# Patient Record
Sex: Female | Born: 1945 | Race: White | Hispanic: No | Smoking: Never smoker
Health system: Southern US, Community
[De-identification: ages and names within clinical notes are randomized; demographics above are authoritative.]

## PROBLEM LIST (undated history)

## (undated) DIAGNOSIS — G5600 Carpal tunnel syndrome, unspecified upper limb: Secondary | ICD-10-CM

## (undated) DIAGNOSIS — M1990 Unspecified osteoarthritis, unspecified site: Secondary | ICD-10-CM

## (undated) DIAGNOSIS — Z9289 Personal history of other medical treatment: Secondary | ICD-10-CM

## (undated) DIAGNOSIS — G8929 Other chronic pain: Secondary | ICD-10-CM

## (undated) DIAGNOSIS — Z8719 Personal history of other diseases of the digestive system: Secondary | ICD-10-CM

## (undated) DIAGNOSIS — K219 Gastro-esophageal reflux disease without esophagitis: Secondary | ICD-10-CM

## (undated) DIAGNOSIS — M549 Dorsalgia, unspecified: Secondary | ICD-10-CM

## (undated) DIAGNOSIS — H9190 Unspecified hearing loss, unspecified ear: Secondary | ICD-10-CM

## (undated) HISTORY — PX: ABDOMINAL HYSTERECTOMY: SHX81

## (undated) HISTORY — PX: TONSILLECTOMY: SUR1361

## (undated) HISTORY — PX: CARPAL TUNNEL RELEASE: SHX101

## (undated) HISTORY — PX: TARSAL TUNNEL RELEASE: SUR1099

## (undated) HISTORY — PX: FRACTURE SURGERY: SHX138

## (undated) HISTORY — PX: APPENDECTOMY: SHX54

## (undated) HISTORY — PX: CATARACT EXTRACTION: SUR2

---

## 1970-06-04 DIAGNOSIS — Z9289 Personal history of other medical treatment: Secondary | ICD-10-CM

## 1970-06-04 HISTORY — DX: Personal history of other medical treatment: Z92.89

## 1997-12-07 ENCOUNTER — Ambulatory Visit (HOSPITAL_COMMUNITY): Admission: RE | Admit: 1997-12-07 | Discharge: 1997-12-07 | Payer: Self-pay | Admitting: Family Medicine

## 2000-02-14 ENCOUNTER — Ambulatory Visit (HOSPITAL_COMMUNITY): Admission: RE | Admit: 2000-02-14 | Discharge: 2000-02-14 | Payer: Self-pay | Admitting: Family Medicine

## 2000-02-14 ENCOUNTER — Encounter: Payer: Self-pay | Admitting: Family Medicine

## 2000-02-19 ENCOUNTER — Encounter: Payer: Self-pay | Admitting: Family Medicine

## 2000-02-19 ENCOUNTER — Encounter: Admission: RE | Admit: 2000-02-19 | Discharge: 2000-02-19 | Payer: Self-pay | Admitting: Family Medicine

## 2000-03-19 ENCOUNTER — Encounter: Admission: RE | Admit: 2000-03-19 | Discharge: 2000-03-19 | Payer: Self-pay | Admitting: Gastroenterology

## 2000-03-19 ENCOUNTER — Encounter: Payer: Self-pay | Admitting: Gastroenterology

## 2001-04-23 ENCOUNTER — Ambulatory Visit (HOSPITAL_COMMUNITY): Admission: RE | Admit: 2001-04-23 | Discharge: 2001-04-23 | Payer: Self-pay | Admitting: Family Medicine

## 2001-04-23 ENCOUNTER — Encounter: Payer: Self-pay | Admitting: Family Medicine

## 2004-11-07 ENCOUNTER — Ambulatory Visit (HOSPITAL_COMMUNITY): Admission: RE | Admit: 2004-11-07 | Discharge: 2004-11-07 | Payer: Self-pay | Admitting: Family Medicine

## 2006-06-04 HISTORY — PX: BACK SURGERY: SHX140

## 2006-09-19 ENCOUNTER — Ambulatory Visit: Admission: RE | Admit: 2006-09-19 | Discharge: 2006-09-19 | Payer: Self-pay | Admitting: Orthopedic Surgery

## 2006-10-02 ENCOUNTER — Ambulatory Visit: Payer: Self-pay | Admitting: Cardiovascular Disease

## 2006-10-14 ENCOUNTER — Encounter: Payer: Self-pay | Admitting: Cardiology

## 2006-10-14 ENCOUNTER — Ambulatory Visit: Payer: Self-pay

## 2006-11-26 ENCOUNTER — Inpatient Hospital Stay (HOSPITAL_COMMUNITY): Admission: RE | Admit: 2006-11-26 | Discharge: 2006-11-29 | Payer: Self-pay | Admitting: Orthopedic Surgery

## 2006-11-26 ENCOUNTER — Encounter (INDEPENDENT_AMBULATORY_CARE_PROVIDER_SITE_OTHER): Payer: Self-pay | Admitting: Orthopedic Surgery

## 2006-11-26 IMAGING — CR DG CHEST 1V PORT
1 series · 1 of 1 positions shown · non-contrast
Comparison: [DATE].

CLINICAL DATA: Central line placement.
 PORTABLE CHEST ([DATE] HOURS):

[view not recorded]
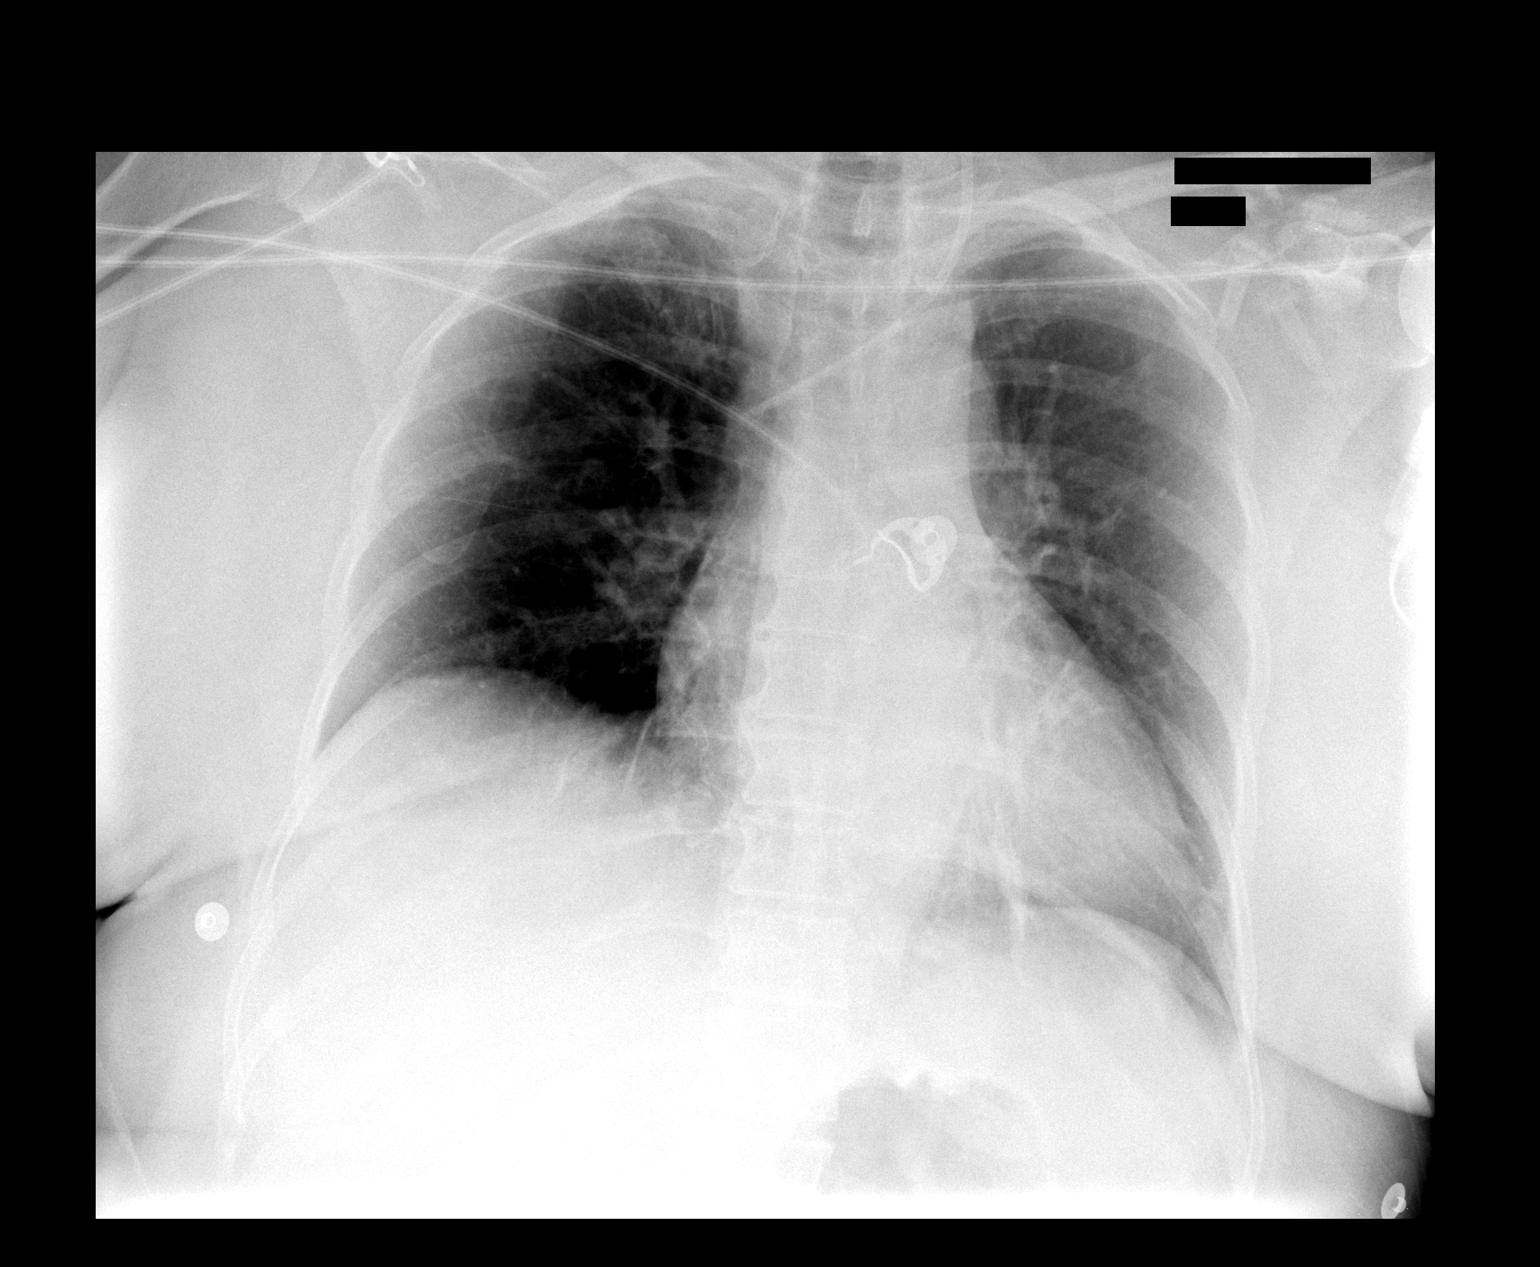

[1 of 1 positions shown; findings below may reference images not displayed]

FINDINGS: Left jugular central line tip is in the SVC.  No pneumothorax.  
 Decreased lung volume with mild atelectasis in the bases since the prior study.
IMPRESSION: 1.  Central line placement in the SVC.  No pneumothorax. 
 2.  Mild bibasilar atelectasis.

## 2007-06-23 ENCOUNTER — Ambulatory Visit (HOSPITAL_BASED_OUTPATIENT_CLINIC_OR_DEPARTMENT_OTHER): Admission: RE | Admit: 2007-06-23 | Discharge: 2007-06-24 | Payer: Self-pay | Admitting: *Deleted

## 2007-09-04 ENCOUNTER — Ambulatory Visit (HOSPITAL_BASED_OUTPATIENT_CLINIC_OR_DEPARTMENT_OTHER): Admission: RE | Admit: 2007-09-04 | Discharge: 2007-09-04 | Payer: Self-pay | Admitting: Orthopedic Surgery

## 2008-05-05 ENCOUNTER — Ambulatory Visit (HOSPITAL_COMMUNITY): Admission: RE | Admit: 2008-05-05 | Discharge: 2008-05-05 | Payer: Self-pay | Admitting: Family Medicine

## 2010-10-17 NOTE — Op Note (Signed)
NAMEJANYAH, [name] NO.:  [id number]   MEDICAL RECORD NO.:  [id number]          PATIENT TYPE:  INP   LOCATION:  5014                         FACILITY:  [facility]   PHYSICIAN:  Nelda Severe, MD      DATE OF BIRTH:  [birth year]   DATE OF PROCEDURE:  11/26/2006  DATE OF DISCHARGE:                               OPERATIVE REPORT   SURGEON:  Nelda Severe, M.D.   ASSISTANT:  [name], P.A.-C.   PREOPERATIVE DIAGNOSIS:  Central disc herniation L5-S1.   POSTOPERATIVE DIAGNOSIS:  Central disc herniation L5-S1.   OPERATIVE PROCEDURE:  Attempted L5-S1 disc arthroplasty, anterior  interbody fusion L5-S1 with Synthes Synfix device and Infuse.   PROCEDURE NOTE:  The patient was placed under general endotracheal  anesthesia.  A Foley catheter was placed in the bladder.  She was  positioned supine on the [place] table.  The arms were abducted on arm  boards to the side.  The hips and knees were gently flexed and supported  on pillows.  Sequential compression devices were in place on both lower  extremities.  A pulse oximeter was placed on the left great toe.  Prior  to induction of anesthesia, a central line had been placed by the  anesthesiologist.  The suprapubic area was clipped of hair.  The abdomen  was prepped with DuraPrep and draped in square fashion.  The drapes were  secured with Ioban.   An oblique left lower quadrant incision was made from the midline  medially to a point between the umbilicus and the iliac crest.  Dissection was carried down through a very thick adipose layer, at least  4 cm, to the anterior rectus sheath which was cut in line with the  incision obliquely.  The rectus sheath was separated from the muscle  anteriorly.  The medial edge of the rectus was identified and retracted  laterally.  The arcuate line was identified.  Blunt dissection was  carried out into the preperitoneal fat and then around laterally into  the retroperitoneum.   The peritoneal sac was separated off the distal  portion of the posterior rectus sheath and posterior rectus sheath  divided longitudinally for about 3-4 cm.   The wound was exceedingly deep.  Self-retaining Thompson retractors were  placed to initially hold the peritoneal sac towards the right side.  The  common iliac vessels on the left side were identified.  The ureter was  on the back of the peritoneum.  The disc space at L5-S1 was identified.  Bipolar coagulation was used to coagulate the median sacral vessels  which were then divided.  The common iliac vessels on the left side were  then mobilized off the disc and it Braugh retractor blade placed and  attached to the [place] frame.  We then used a Braugh blade to dissect  laterally and retract the right common iliac vessels.  Two other Braugh  blades were used to retract.  One was used to reach retract the more  proximal portion of the left common iliac vein proximally and the other  to retract the  pelvic contents distally.  We placed a screw in what I  perceived to be the center of the disc at L5-S1.  A cross table lateral  radiograph was taken which showed the correct level and an AP radiograph  showing that the screw was to the right of midline.   We then marked the inferior edge of the L5 vertebral body a distance of  about 5 mm left of the center of the screw head.  The screw was removed.  We then incised the disc in a rectangular fashion.  Special elevators  were then used to separate the disc from the endplate above and below.  Curets were used to separate the annular fibers laterally.  About 85% of  the disc was then delivered in one piece.   I then used curets judiciously to remove the annulus posteriorly back to  the spinal canal.  We did not actually exposed dura, but an angled curet  was admitted over the top of the sacrum posteriorly and behind the  posterior edge of L5 proximally.  Once I had cleared the disc space  out  as well as possible and released the fibers, a small block Prodisc trial  spacer was inserted.  The radiograph showed that it was off center, to  the right.  I then proceeded to do more releasing on the left side.  Approximately 45 to 60 minutes was spent trying to get the spacer to  seat stable in the central location as judged by radiographs.  This  ultimately appeared impossible.  I felt that in order to do this, I  would have to remove some endplate from S1 and L5 on the left side and  that this risked weakening the bone and the resulting in a fracture.  Therefore, I decided to perform an anterior interbody fusion using the  Synfix device and BMP.   The small footprint block, minimum thickness, was inserted and fit  nicely, although somewhat to the right side, again.  The appropriate  implant was chosen.  A small pack of BMP had been opened and the  collagen fiber sponge soaked with Infuse.  The slots in the spacer were  then filled with collagen fiber soaked with BMP.  The implant was  inserted with the Squid inserter.  It was countersunk slightly with a  mallet and impactor.  We then mounted the jig for creating holes in the  L5-S1 vertebra.  These holes were made and 2 cm screws inserted and the  implants fixed in place.   A very small amount of bleeding distal to the L5-S1 space was  controlled, after removing the distally placed retractor with bipolar  coagulation and a piece of Gelfoam placed over an area that was oozing.  The other retractors were all removed sequentially.  There was no more  bleeding.  The rectus sheath was then closed in continuous fashion using  0 Vicryl suture.  The subcutaneous layer was closed using interrupted 2-  0 Vicryl suture and the skin closed using subcuticular 3-0 undyed Vicryl  continuously.  The skin edges were reinforced Steri-Strips.  A  nonadherent antibiotic dressing was applied and secured with OpSite.   It should be noted that  about 45 minutes prior to the termination of  procedure, the pulse oximeter on the left great toe suddenly began to  read 0.  This was not at a point when we had changed retractors or done  any physical maneuver in the wound.  This was of some concern, however,  about 30 minutes later it suddenly began to read 98-100% oxygen  saturation in the great toe and continued to read that level of  saturation as well as sensing the pulse.   While [employer], P.A.-C., was closing the skin, I scrubbed out and  could palpate the dorsalis pedis pulse of the left foot very well.  I  could not palpate the pedal pulses on the right side, but the capillary  filling was extremely brisk and the foot was warm.  We will try again in  the recovery room to palpate the pulse.   There were no intraoperative complications, other than our inability to  utilize the Prodisc implant.  I spoke with the patient's family about  the fact that we performed anterior interbody fusion because of the fact  that I felt that I could not achieve technical adequacy with the disc  replacement.  Blood loss estimated at 600 mL.  Sponge and needle counts  were correct.      Nelda Severe, MD  Electronically Signed    MT/MEDQ  D:  11/26/2006  T:  11/26/2006  Job:  [id number]

## 2010-10-17 NOTE — Discharge Summary (Signed)
NAMEJAHNAY, LANTIER NO.:  [id number]   MEDICAL RECORD NO.:  [id number]          PATIENT TYPE:  INP   LOCATION:  5014                         FACILITY:  [facility]   PHYSICIAN:  Nelda Severe, MD      DATE OF BIRTH:  01-31-1946   DATE OF ADMISSION:  11/26/2006  DATE OF DISCHARGE:  11/29/2006                               DISCHARGE SUMMARY   [name] was brought into [facility] on November 26, 2006 by Dr.  Nelda Severe for central disc herniation at L5-S1.   PREOPERATIVE DIAGNOSIS:  Central disc herniation L5-S1.   HISTORY OF BRIEF STAY:  Patient was taken to the OR November 26, 2006.  Dr.  Nelda Severe actually performed a fusion at anterior L5-S1 lumbar  spine.  Patient tolerated this well in the postoperative care unit.  Active range of motion was intact to bilateral lower extremities.  Sensation was grossly intact and palpable DP pulses.  Estimated blood  loss was 800 mL.  She was then admitted to [facility], Room [id number].  Postoperative day one, patient was stable.  Back feels much better per  patient.  She was afebrile.  Vital signs were stable.  Her hemoglobin  was 8.5, white blood cell count 15.2, PT was 16.4 and INR 1.3.  Electrolytes were within normal limits.  Palpable pulses, active range  of motion and sensation.  Bilateral lower extremities were intact and  equal.  Calves were soft and nontender to palpation; TED hose were on  and SCDs were in place as well.  Physical therapy was ordered for  ambulation.  PT worked with the patient on November 27, 2006, stated the  patient was independent with mobility and walking with a rolling walker  and discharged from physical therapy; goals were met.  Postoperative day  two, patient was on bedside toilet, passing gas, eating a regular diet  at this point.  She had a temperature maximum of 99.3.  Vital signs were  stable.  PT was 24.1, INR 2.1.  Ambulating with standby assist.  Calves  were soft, nontender.  Active  range of motion.  Bilateral lower  extremities intact.  Dressing was clean and dry on the abdomen.  She has  a small area on the anterolateral left thigh that has decreased  sensation compared to the rest of the lower extremity, otherwise no  musculoskeletal changes.  Discontinued her PCA her and Foley and gave  her 3 mg of Coumadin.  Postoperative day three, patient states she is  ready to go home.  She is doing well.  She still have the numbness on  the anterolateral thigh.  She has no temperature.  She is afebrile.  Vital signs are stable.  Her PT/INR is at 1.9.  Hemoglobin stable at  8.8, white count normal.  Incision site clean, no active drainage.  She  does have a small pinpoint of blood on the very proximal portion of the  incision.  There is no abnormal erythema.  Belly is soft.  Positive  bowel sounds.  Distally neurovascular motor intact.  Calves soft  nontender.   DISCHARGE DIAGNOSES:  Status post anterior lumbar fusion L5-S1.   PLAN:  We are going to get home health for PT/INR for six weeks total  from day of surgery.  I am going to order her a rolling walker, a 3-in-1  toilet seat/shower chair and sending her home with Norco 10/325 one to  two every four hours p.r.n. for pain control and Coumadin, we are going  to give her 4 mg to take tonight at 6:00 p.m. and that will be dosed  according to her PT/INR blood draws.  Want her to follow up in the  office late July or early August.  If she has any troubles or concerns,  she is welcome to call us at any time, this was discussed with her and  her husband at bedside and this was all agreed upon.   DISPOSITION:  Stable.   DIET:  Regular.      Courtney Hill, P.A.      Nelda Severe, MD  Electronically Signed    MC/MEDQ  D:  11/29/2006  T:  11/30/2006  Job:  [id number]

## 2010-10-17 NOTE — H&P (Signed)
NAMELEXANY, BELKNAP               ACCOUNT NO.:  [id number]   MEDICAL RECORD NO.:  [id number]          PATIENT TYPE:  INP   LOCATION:  NA                           FACILITY:  [facility]   PHYSICIAN:  [name], P.A.  DATE OF BIRTH:  12/08/1945   DATE OF ADMISSION:  DATE OF DISCHARGE:                              HISTORY & PHYSICAL   CHIEF COMPLAINT:  Lumbar pain status post fusion L5-S1 area.   ALLERGIES TO MEDICATIONS:  No known drug allergies.   CURRENT MEDICATIONS:  Include Vicodin 5/500 and Prilosec OTC.   PAST SURGICAL HISTORY:  Includes:  1. Bilateral carpal tunnel.  2. Hysterectomy.  3. Appendectomy.  4. Tonsil tunnel on the left.  5. Plantar fascial release on the left.  6. Childbirth x6.   PAST HISTORY OF HER FAMILY:  Includes hypertension, cancer, melanoma,  leukemia, seizures.   REVIEW OF SYSTEMS:  She reports no recent weight loss, weight gain.  No  night sweats.  No fevers.  No chills.  No chronic cough.  No changes in  vision or hearing.  No seizures or blackouts.  No bowel or bladder  incontinence.   PHYSICAL EXAMINATION:  VITAL SIGNS:  Her temperature was 97.7, pulse was  73, respirations 12, blood pressure 126/80.  GENERAL:  She is well-developed, well-nourished.  HEENT:  Normocephalic in appearance.  Pupils are equal and round,  reactive to light.  NECK:  Active range of motion, full.  No adenopathy.  CHEST:  Clear to auscultation.  No wheezes.  HEART:  Regular rate and rhythm.  No murmur.  ABDOMEN:  Soft, nontender to palpation.  Positive bowel sounds on  auscultation.  EXTREMITIES:  Grossly neurovascularly and motor intact.  SKIN:  Clean, dry and intact.   MRI shows L5-S1 disc herniation, central.   PLAN:  Anterior disc replacement with ProDisc by Dr. Nelda Severe.     [name], P.A.    MC/MEDQ  D:  11/25/2006  T:  11/25/2006  Job:  [id number]

## 2010-10-17 NOTE — Assessment & Plan Note (Signed)
[facility]                            CARDIOLOGY OFFICE NOTE   NAME:FIELDSMai, Longnecker                      MRN:          [id number]  DATE:10/02/2006                            DOB:          1946/05/13    HISTORY OF PRESENT ILLNESS:  Mrs. Rattan was seen today for preop  clearance and an abnormal EKG.  Dr. Alveda Reasons is to do a disk replacement on  her.  The surgery was apparently scheduled for April 22, but was  cancelled due to an abnormal EKG.   I reviewed he EKG's from Dr. Burr Medico office.  She has an incomplete right  bundle branch block and Q waves in leads 3 and F.  In talking to the  patient, she has never had a heart problem.  She really does not have  significant risk factors.  She is not hypertensive, nondiabetic.  She is  not a smoker.  Family history is unremarkable.   The patient's activity is primarily limited by her back pain.  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  There is some exertional Nelda Severe, MD,  but this sounds more like deconditioning.   In particular, there has been no emphysema, no COPD, no chronic lung  problems.  She has never had palpitations, arrhythmias, syncope or chest  pain.   She has not had any major surgery since 2006 when she had carpal tunnel,  but she has never had an aesthetic problem.   There is also no history of bleeding diaphysis.   The patient has not had a stress test or echo in the past.   She denies any history of myocardial infarction.   REVIEW OF SYSTEMS:  Otherwise, negative.   Family history non-contributory   The patient hurt her back many years ago, and it was from her mail  carrying route.  Apparently, she was prescribed some physical therapy  after her carpal tunnel surgery, and feels that it was too aggressive,  and then she hurt her disk.   She is married.  She has 6 children and 10 grandchildren.  Unfortunately, her mother and father, as well as a brother, all died in  a car  accident.  He has limited hobbies and does not drink or smoke.   The patient is currently on disability as she is unable to carry the  mail.   PAST SURGICAL HISTORY:  1. Bilateral carpal tunnel surgery in 2006.  2. Tarsal tunnel release and plantar fasciitis surgery in 2004 and      2003.  3. Hysterectomy in 1984.  4. Appendectomy in 1968.   MEDICATIONS:  1. Pain medications.  2. Hydrocodone  3. Prilosec 20 mg daily.   ALLERGIES:  No known drug allergies.   PHYSICAL EXAMINATION:  Healthy appearing white female in no distress  VITAL SIGNS:  Blood pressure 130/70, pulse 70 and regular. RR 12  afebrile  HEENT:  Normal.  LUNGS:  Clear.  No carotid bruits.  No thyromegaly.  HEART:  Normal S1, S2.  Normal heart sounds. PMI normal  ABDOMEN:  Benign.  NO masses  AAA, HSM or HJr  LOWER EXTREMITIES:  Intact pulses, no edema.  NEUROLOGICAL:  Nonfocal.  SKIN:  Warm and dry.  No lymadenopathy  No muscular weakness   IMPRESSION:  Preop clearance in an overweight 65 year old with limited  risk factors.  The patient does have Q waves in 3 and F with an  incomplete right bundle branch block.  I think it is reasonable to  proceed with an Adenosine Myoview.  Will also do a 2D echocardiogram to  rule out regional wall motion abnormalities.  The patient does get some  exertional dyspnea.   However, I suspect that both these tests will be normal, and she will be  able to schedule her surgery after next week with Dr. Alveda Reasons.   The patient will continue her pain medication for her disk problems.  She will continue her Prilosec for some reflex that she gets after  taking her pain medication.  Otherwise, her physical examination is  normal, and I think that she should do well with these tests and with  her back surgery in the next few weeks.     Noralyn Pick. Eden Emms, MD, [facility]  Electronically Signed    PCN/MedQ  DD: 10/02/2006  DT: 10/02/2006  Job #: [id number]   cc:   Nelda Severe, MD

## 2010-10-17 NOTE — Op Note (Signed)
Courtney Hill, Courtney Hill               ACCOUNT NO.:  [id number]   MEDICAL RECORD NO.:  [id number]          PATIENT TYPE:  AMB   LOCATION:  DSC                          FACILITY:  [facility]   PHYSICIAN:  Lowell Bouton, M.D.DATE OF BIRTH:  December 25, 1945   DATE OF PROCEDURE:  06/23/2007  DATE OF DISCHARGE:                               OPERATIVE REPORT   PREOPERATIVE DIAGNOSIS:  Comminuted intra-articular fracture, left  distal radius.   POSTOPERATIVE DIAGNOSIS:  Comminuted intra-articular fracture, left  distal radius.   PROCEDURE:  Open reduction internal fixation, left distal radius  fracture.   SURGEON:  Dr. Metro Kung.   ANESTHESIA:  General.   OPERATIVE FINDINGS:  The patient had a comminuted fracture of the distal  radius with volar angulation and intra-articular component.   PROCEDURE:  Under general anesthesia with a tourniquet on the left arm,  the left hand was prepped and draped in usual fashion.  After  exsanguinating the limb, the tourniquet was inflated to 250 mmHg.  A  longitudinal incision was made overlying the FCR tendon in the volar  forearm.  It was carried down through the subcutaneous tissues, and  bleeding points were coagulated.  Blunt dissection was carried down to  the FCR tendon sheath which was incised longitudinally.  The tendon was  then retracted ulnarly, and the floor of the sheath was incised with the  scissors.  Blunt dissection was carried down radial to the FPL muscle  belly, and Weitlaner's retractors were inserted.  The pronator quadratus  was identified and was divided longitudinally from its insertion on the  radius.  It was then elevated with a Therapist, nutritional.  The fracture site  was identified and was cleansed.  Prior to the incision, the index, long  and ring fingers had been placed in finger traps with 10 pounds of  traction suspended over the end of the arm board.  The fracture was then  reduced, and the volar tilt was corrected.  A  Freer elevator was used to  reduce the volar fragments.  DVR plate was then inserted, and x-rays  showed good alignment of the fracture and good position of the plate.  The plate was then applied using a 3.5-mm screw in the slotted hole  proximally.  Five pegs were then inserted distally.  X-rays showed good  position with no evidence of penetration of the joint by the pegs.  The  remaining two screws were placed proximally.  A sixth peg was not  inserted.  The final x-rays including fluoroscopic visualization of the  fracture site looked near anatomic.  The wound was then irrigated  copiously with saline.  A TLSO drain was inserted.  The pronator  quadratus was repaired with 4-0 Vicryl.  Half percent Marcaine was  placed in the skin edges for pain control.  Subcutaneous tissue was  closed with Vicryl.  The skin was closed  with a 3-0 subcuticular Prolene.  Steri-Strips were applied followed by  sterile dressings and a volar wrist splint.  The patient tolerated the  procedure well.  Tourniquet was released with good circulation of  the  hand.  She went to the recovery room awake and stable and good  condition.      Lowell Bouton, M.D.  Electronically Signed     EMM/MEDQ  D:  06/23/2007  T:  06/23/2007  Job:  [id number]

## 2010-10-17 NOTE — Op Note (Signed)
NAMEMYLEY, BAHNER               ACCOUNT NO.:  [id number]   MEDICAL RECORD NO.:  [id number]          PATIENT TYPE:  AMB   LOCATION:  NESC                         [facility]AC[facility]   PHYSICIAN:  Deidre Ala, M.D.    DATE OF BIRTH:  [birth year]   DATE OF PROCEDURE:  09/04/2007  DATE OF DISCHARGE:                               OPERATIVE REPORT   PREOPERATIVE DIAGNOSES:  1. Left fourth trigger finger stenosing tenosynovitis, A1 pulley.  2. Trigger finger A1 pulley, left second; and trigger finger, left A1,      left third finger.   POSTOPERATIVE DIAGNOSES:  1. Left fourth trigger finger stenosing tenosynovitis, A1 pulley.  2. Trigger finger A1 pulley, left second; and trigger finger, left A1,      left third finger.   PROCEDURE:  1. Release A1 pulley, left fourth trigger finger, STS.  2. Inject cortisone, Marcaine, left second finger A1 pulley trigger      finger.  3. Inject cortisone, Marcaine, left third A1 pulley trigger finger.   SURGEON:  Doristine Section, MD.   ASSISTANT:  Phineas Semen, PAC.   ANESTHESIA:  General with LMA.   CULTURES:  None.   DRAINS:  None.   ESTIMATED BLOOD LOSS:  Minimal.   TOURNIQUET TIME:  13 minutes.   PATHOLOGIC FINDINGS AND HISTORY:  Courtney Hill is a 65 year old female who has  had longstanding repetitive inflammation of her left arm and hand with  carpal tunnel syndrome in the past.  We diagnosed and have treated  conservatively trigger fingers, left fourth, second and third.  She has  had several injections but ultimately the fourth finger was the most  problematic with triggering and we sought approval from workers' comp  from the [employer] and finally it came through.  She is still  having active triggering, left fourth, and the second and third were  milder, so we elected only to inject those.  We used 1 mL of 80 mg/mL  Depo-Medrol, Marcaine and 1 mL 80 mg Depo-Medrol and 1 mL 0.5% Marcaine  plain into both fingers at the A1  pulley.   PROCEDURE:  With adequate anesthesia obtained using LMA technique, 1  gram Ancef given IV prophylaxis, the patient was placed in the supine  position.  The left upper extremity was prepped from the fingertips to  the upper forearm in a standard fashion.  After standard prepping and  draping, Esmarch exsanguination was used.  The tourniquet was let up to  250 mmHg.  We then made an incision along the palmar flexion crease of  the MP joint in the palm distally at the A1 pulley of the left fourth  finger.  The incision was deepened sharply with a knife and hemostasis  obtained using the Bovie electrocoagulator.  Under loupe magnification,  dissection was carried down to the subcutaneum and then down to the  tendon sheath.  Retractors were placed and the tendon sheath A1 pulley  was split longitudinally from proximal to distal, releasing the flexor  tendon and allowing it to glide.  Care was taken to protect the  digital  bundles on both sides and the neurovascular bundles.  Irrigation was  carried out.  That wound was closed with a running 4-0 nylon.  Both the  second and third finger were then injected subcutaneously along the A1  pulley with the above-listed mixture.  A bulky sterile compressive  dressing was applied and the patient, having tolerated procedure well,  was awakened, taken to the recovery room in satisfactory condition to be  discharged per outpatient routine.  Given Vicodin for pain and told call  the office for recheck on Saturday.           ______________________________  V. Charlesetta Shanks, M.D.     VEP/MEDQ  D:  09/04/2007  T:  09/04/2007  Job:  [id number]   cc:   Deidre Ala, M.D.  Fax: [phone]

## 2010-10-20 NOTE — H&P (Signed)
Courtney Hill, Courtney Hill               ACCOUNT NO.:  [id number]   MEDICAL RECORD NO.:  [id number]          PATIENT TYPE:  INP   LOCATION:  NA                           FACILITY:  [facility]   PHYSICIAN:  Nelda Severe, MD      DATE OF BIRTH:  Mar 16, 1946   DATE OF ADMISSION:  DATE OF DISCHARGE:                              HISTORY & PHYSICAL   INDICATIONS:  This is a 65 year old female who comes in with central low  back pain at the area of L5-S1.   DRUG ALLERGIES:  NO KNOWN DRUG ALLERGIES.   CURRENT MEDICATIONS:  Include:  1. Vicodin 5/500 one q.6.  2. Prilosec over-the-counter 20 mg once daily for acid reflux.   PAST SURGICAL HISTORY:  Includes:  1. Carpal tunnel.  2. Hysterectomy.  3. Appendectomy.  4. Tarsal tunnel.  5. Plantar facial release.  6. Childbirth x6.   FAMILY HISTORY:  Includes coronary artery disease, cancer, seizures and  lung disease.   REVIEW OF SYSTEMS:  This patient reports no fever.  No chills.  No night  sweats.  No chronic cough.  No nausea, vomiting or diarrhea.  No  hemoptysis.  No change in vision or hearing.  She does report frequent  loose stools, nervous tension and sleep disturbance.   PHYSICAL EXAMINATION:  VITAL SIGNS:  Today, her temperature is 97.2.  Pulse is 55.  Respirations 20.  Blood pressure is 153/76.  GENERAL:  She is well-developed, well-nourished.  HEENT:  Her pupils are equal, round and reactive to light.  NECK:  Supple.  Nontender to palpation.  Full range of motion.  CHEST:  Clear to auscultation.  No wheezes noted.  HEART:  Regular rate and rhythm.  No murmur noted.  ABDOMEN:  Soft, nontender to palpation.  She has positive bowel sounds  on auscultation.  Sensation to light touch.  EXTREMITIES:  Bilateral lower extremities intact and equal.  Active  range of motion and strength are 5/5 bilateral lower extremities.  SKIN:  Clean, dry and intact.   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   PLAN:  Prodisc replacement, anterior lumbar L5-S1, by Dr. Nelda Severe.      [name], P.A.      Nelda Severe, MD  Electronically Signed    MC/MEDQ  D:  09/20/2006  T:  09/21/2006  Job:  [phone]

## 2011-02-22 LAB — POCT HEMOGLOBIN-HEMACUE: Hemoglobin: 12.4

## 2011-02-27 LAB — POCT HEMOGLOBIN-HEMACUE
Hemoglobin: 16.3 — ABNORMAL HIGH
Operator id: 114531

## 2011-03-21 LAB — COMPREHENSIVE METABOLIC PANEL
ALT: 42 — ABNORMAL HIGH
Alkaline Phosphatase: 56
BUN: 12
CO2: 23
Chloride: 106
Glucose, Bld: 93
Potassium: 4.5
Sodium: 137
Total Bilirubin: 0.5
Total Protein: 7.2

## 2011-03-21 LAB — CBC
HCT: 25.5 — ABNORMAL LOW
HCT: 25.7 — ABNORMAL LOW
HCT: 39.2
Hemoglobin: 13.1
MCHC: 33.5
MCHC: 33.7
MCHC: 34.1
MCV: 85.4
MCV: 86.5
MCV: 86.8
Platelets: 184
Platelets: 189
Platelets: 204
RBC: 4.55
RDW: 13.4
RDW: 13.5
WBC: 10
WBC: 10.4
WBC: 11.5 — ABNORMAL HIGH

## 2011-03-21 LAB — URINALYSIS, ROUTINE W REFLEX MICROSCOPIC
Bilirubin Urine: NEGATIVE
Hgb urine dipstick: NEGATIVE
Ketones, ur: NEGATIVE
Specific Gravity, Urine: 1.003 — ABNORMAL LOW (ref 1.005–1.035)
Urobilinogen, UA: 0.2
pH: 6.5

## 2011-03-21 LAB — BASIC METABOLIC PANEL
BUN: 11
BUN: 7
BUN: 8
CO2: 29
Calcium: 7.9 — ABNORMAL LOW
Chloride: 103
Chloride: 108
Creatinine, Ser: 0.64
Creatinine, Ser: 0.75
Glucose, Bld: 122 — ABNORMAL HIGH
Glucose, Bld: 153 — ABNORMAL HIGH
Potassium: 4.3

## 2011-03-21 LAB — DIFFERENTIAL
Basophils Absolute: 0
Basophils Relative: 0
Eosinophils Absolute: 0.2
Monocytes Relative: 9
Neutro Abs: 6.9
Neutrophils Relative %: 69

## 2011-03-21 LAB — URINE CULTURE: Colony Count: NO GROWTH

## 2011-03-21 LAB — TYPE AND SCREEN
ABO/RH(D): AB POS
Antibody Screen: NEGATIVE

## 2011-03-21 LAB — PROTIME-INR
INR: 1
INR: 1.9 — ABNORMAL HIGH
Prothrombin Time: 22.7 — ABNORMAL HIGH

## 2011-06-18 DIAGNOSIS — R51 Headache: Secondary | ICD-10-CM | POA: Diagnosis not present

## 2011-06-18 DIAGNOSIS — Z23 Encounter for immunization: Secondary | ICD-10-CM | POA: Diagnosis not present

## 2011-06-18 DIAGNOSIS — Z1212 Encounter for screening for malignant neoplasm of rectum: Secondary | ICD-10-CM | POA: Diagnosis not present

## 2011-07-18 DIAGNOSIS — M47817 Spondylosis without myelopathy or radiculopathy, lumbosacral region: Secondary | ICD-10-CM | POA: Diagnosis not present

## 2012-03-27 DIAGNOSIS — M47817 Spondylosis without myelopathy or radiculopathy, lumbosacral region: Secondary | ICD-10-CM | POA: Diagnosis not present

## 2012-03-29 ENCOUNTER — Emergency Department (HOSPITAL_COMMUNITY): Payer: Medicare Other

## 2012-03-29 ENCOUNTER — Encounter (HOSPITAL_COMMUNITY): Payer: Self-pay | Admitting: *Deleted

## 2012-03-29 ENCOUNTER — Emergency Department (HOSPITAL_COMMUNITY)
Admission: EM | Admit: 2012-03-29 | Discharge: 2012-03-30 | Disposition: A | Discharge status: Home / Self Care | Payer: Medicare Other | Attending: Emergency Medicine | Admitting: Emergency Medicine

## 2012-03-29 DIAGNOSIS — X500XXA Overexertion from strenuous movement or load, initial encounter: Secondary | ICD-10-CM | POA: Insufficient documentation

## 2012-03-29 DIAGNOSIS — S42209A Unspecified fracture of upper end of unspecified humerus, initial encounter for closed fracture: Secondary | ICD-10-CM | POA: Diagnosis not present

## 2012-03-29 DIAGNOSIS — M549 Dorsalgia, unspecified: Secondary | ICD-10-CM | POA: Diagnosis not present

## 2012-03-29 DIAGNOSIS — Z79899 Other long term (current) drug therapy: Secondary | ICD-10-CM | POA: Diagnosis not present

## 2012-03-29 DIAGNOSIS — S42309A Unspecified fracture of shaft of humerus, unspecified arm, initial encounter for closed fracture: Secondary | ICD-10-CM

## 2012-03-29 DIAGNOSIS — R040 Epistaxis: Secondary | ICD-10-CM | POA: Insufficient documentation

## 2012-03-29 DIAGNOSIS — Y939 Activity, unspecified: Secondary | ICD-10-CM | POA: Insufficient documentation

## 2012-03-29 DIAGNOSIS — Z8739 Personal history of other diseases of the musculoskeletal system and connective tissue: Secondary | ICD-10-CM | POA: Insufficient documentation

## 2012-03-29 DIAGNOSIS — Y929 Unspecified place or not applicable: Secondary | ICD-10-CM | POA: Insufficient documentation

## 2012-03-29 DIAGNOSIS — M79609 Pain in unspecified limb: Secondary | ICD-10-CM | POA: Diagnosis not present

## 2012-03-29 HISTORY — DX: Dorsalgia, unspecified: M54.9

## 2012-03-29 HISTORY — DX: Carpal tunnel syndrome, unspecified upper limb: G56.00

## 2012-03-29 HISTORY — DX: Other chronic pain: G89.29

## 2012-03-29 MED ORDER — ONDANSETRON HCL 4 MG/2ML IJ SOLN
4.0000 mg | Freq: Once | INTRAMUSCULAR | Status: AC
  Administered 2012-03-29: 4 mg via INTRAVENOUS
  Filled 2012-03-29: qty 2

## 2012-03-29 MED ORDER — ONDANSETRON 8 MG PO TBDP: 8.0000 mg | ORAL_TABLET | Freq: Three times a day (TID) | ORAL | Status: DC | PRN

## 2012-03-29 MED ORDER — HYDROMORPHONE HCL PF 1 MG/ML IJ SOLN
1.0000 mg | Freq: Once | INTRAMUSCULAR | Status: AC
  Administered 2012-03-29: 1 mg via INTRAVENOUS
  Filled 2012-03-29: qty 1

## 2012-03-29 MED ORDER — SODIUM CHLORIDE 0.9 % IV SOLN
Freq: Once | INTRAVENOUS | Status: AC
  Administered 2012-03-29: 50 mL via INTRAVENOUS

## 2012-03-29 MED ORDER — ONDANSETRON 4 MG PO TBDP
8.0000 mg | ORAL_TABLET | Freq: Once | ORAL | Status: AC
  Administered 2012-03-29: 8 mg via ORAL
  Filled 2012-03-29: qty 2

## 2012-03-29 MED ORDER — OXYCODONE-ACETAMINOPHEN 5-325 MG PO TABS: 1.0000 | ORAL_TABLET | Freq: Four times a day (QID) | ORAL | Status: DC | PRN

## 2012-03-29 NOTE — ED Notes (Signed)
PT turned her ankle on uneven pavement and fell, catching her fall with her L arm.  Pt will not let go of her L arm to allow assessment.

## 2012-03-29 NOTE — ED Provider Notes (Signed)
History     CSN: [id number]  Arrival date & time 03/29/12  1950   First MD Initiated Contact with Patient 03/29/12 2129      Chief Complaint  Patient presents with  . Fall    (Consider location/radiation/quality/duration/timing/severity/associated sxs/prior treatment) HPI Comments: Patient presents with complaint of left upper arm pain that began acutely just prior to arrival. Patient states she twisted her ankle and fell to the ground onto an outstretched left arm. Patient lightly struck the front of her head. She denies loss of consciousness. Patient denies blurry vision or vomiting. She denies any use of blood thinning medications. She denies confusion. Patient's denies pain in other areas including her neck and legs. Onset acute. Course is constant. Movement makes the pain worse. Nothing makes it better.  Patient is a 66 y.o. female presenting with fall. The history is provided by the patient.  Fall Pertinent negatives include no fever, no numbness, no abdominal pain, no nausea, no vomiting and no headaches.    Past Medical History  Diagnosis Date  . Chronic back pain   . Carpal tunnel syndrome     Past Surgical History  Procedure Date  . Back surgery   . Carpal tunnel release     No family history on file.  History  Substance Use Topics  . Smoking status: Never Smoker   . Smokeless tobacco: Not on file  . Alcohol Use: No    OB History    Grav Para Term Preterm Abortions TAB SAB Ect Mult Living                  Review of Systems  Constitutional: Negative for fever and fatigue.  HENT: Positive for nosebleeds. Negative for sore throat, rhinorrhea, neck pain and tinnitus.   Eyes: Negative for photophobia, pain, redness and visual disturbance.  Respiratory: Negative for cough and shortness of breath.   Cardiovascular: Negative for chest pain.  Gastrointestinal: Negative for nausea, vomiting, abdominal pain and diarrhea.  Genitourinary: Negative for dysuria.    Musculoskeletal: Positive for myalgias and arthralgias. Negative for back pain, joint swelling and gait problem.  Skin: Positive for wound. Negative for color change and rash.  Neurological: Negative for dizziness, weakness, light-headedness, numbness and headaches.  Psychiatric/Behavioral: Negative for confusion and decreased concentration.    Allergies  Review of patient's allergies indicates no known allergies.  Home Medications   Current Outpatient Rx  Name Route Sig Dispense Refill  . HYDROCODONE-ACETAMINOPHEN 5-325 MG PO TABS Oral Take 1 tablet by mouth every 6 (six) hours as needed. For pain    . OMEPRAZOLE MAGNESIUM 20 MG PO TBEC Oral Take 20 mg by mouth daily.    Marland Kitchen ONDANSETRON 8 MG PO TBDP Oral Take 1 tablet (8 mg total) by mouth every 8 (eight) hours as needed for nausea. 6 tablet 0  . OXYCODONE-ACETAMINOPHEN 5-325 MG PO TABS Oral Take 1-2 tablets by mouth every 6 (six) hours as needed for pain. 20 tablet 0    BP 150/72  Pulse 90  Temp 97.5 F (36.4 C) (Oral)  Resp 18  SpO2 97%  Physical Exam  Nursing note and vitals reviewed. Constitutional: She is oriented to person, place, and time. She appears well-developed and well-nourished.  HENT:  Head: Normocephalic and atraumatic. Head is without raccoon's eyes and without Battle's sign.  Right Ear: Tympanic membrane, external ear and ear canal normal. No hemotympanum.  Left Ear: Tympanic membrane, external ear and ear canal normal. No hemotympanum.  Nose: No  nasal septal hematoma. Epistaxis (dried blood) is observed.  Mouth/Throat: Uvula is midline, oropharynx is clear and moist and mucous membranes are normal.  Eyes: Conjunctivae normal, EOM and lids are normal. Pupils are equal, round, and reactive to light. Right eye exhibits no nystagmus. Left eye exhibits no nystagmus.       No visible hyphema noted  Neck: Normal range of motion. Neck supple.  Cardiovascular: Normal rate and regular rhythm.  Exam reveals no decreased  pulses.   Pulses:      Radial pulses are 2+ on the right side, and 2+ on the left side.  Pulmonary/Chest: Effort normal and breath sounds normal.  Abdominal: Soft. There is no tenderness.  Musculoskeletal: She exhibits tenderness. She exhibits no edema.       Left shoulder: Normal.       Left elbow: Normal.       Left wrist: Normal.       Cervical back: Normal. She exhibits normal range of motion, no tenderness and no bony tenderness.       Thoracic back: She exhibits no tenderness and no bony tenderness.       Lumbar back: She exhibits no tenderness and no bony tenderness.       Left forearm: She exhibits tenderness, bony tenderness and swelling. She exhibits no deformity.       Arms: Neurological: She is alert and oriented to person, place, and time. She has normal strength and normal reflexes. No cranial nerve deficit or sensory deficit. Coordination normal. GCS eye subscore is 4. GCS verbal subscore is 5. GCS motor subscore is 6.       Motor, sensation, and vascular distal to the injury is fully intact.   Skin: Skin is warm and dry.  Psychiatric: She has a normal mood and affect.    ED Course  Procedures (including critical care time)  Labs Reviewed - No data to display Dg Humerus Left  03/29/2012  *RADIOLOGY REPORT*  Clinical Data: Pain in the left humerus after fall.  LEFT HUMERUS - 2+ VIEW  Comparison: None.  Findings: There is a comminuted oblique fracture of the proximal and mid shaft of the left humerus with lateral displacement and medial angulation of the distal fracture fragments.  Mild overriding of fracture fragments.  Fracture lines appear to extend almost to the humeral neck.  No glenohumeral subluxation.  No underlying bone lesion is appreciated.  IMPRESSION: Comminuted fractures of the proximal and mid shaft of the left humerus.   Original Report Authenticated By: Marlon Pel, M.D.      1. Humerus fracture     9:44 PM Patient seen and examined. X-ray  reviewed. D/w Dr. Karma Ganja. Will call [facility].    Vital signs reviewed and are as follows: Filed Vitals:   03/29/12 2026  BP: 150/72  Pulse: 90  Temp: 97.5 F (36.4 C)  Resp: 18   9:55 PM Spoke with Dr. Luiz Blare who advises coaptation splint, follow-up Monday.   Patient exam prior to d/c unchanged. No evidence of significant head injury. Pt informed of d/w Dr. Luiz Blare.   Splint by ortho tech.   Patient counseled on use of narcotic pain medications. Counseled not to combine these medications with others containing tylenol. Urged not to drink alcohol, drive, or perform any other activities that requires focus while taking these medications. The patient verbalizes understanding and agrees with the plan.    MDM  Proximal/mid-radius fracture without neurovascular compromise. Ortho f/u arranged. No head injury suspected.  No neck injury suspected. No shoulder or wrist injury suspected.         Renne Crigler, [place] 03/31/12 [phone]

## 2012-03-29 NOTE — Progress Notes (Signed)
Orthopedic Tech Progress Note Patient Details:  Courtney Hill [birth year] [id number]  Ortho Devices Type of Ortho Device: Coapt;Sling immobilizer Ortho Device/Splint Location: left arm Ortho Device/Splint Interventions: Application   Gabriel Paulding 03/29/2012, 11:00 PM

## 2012-03-29 NOTE — Progress Notes (Signed)
Orthopedic Tech Progress Note Patient Details:  Courtney Hill 08-13-1945 [id number]  Patient ID: Courtney Hill, female   DOB: 1945/12/27, 66 y.o.   MRN: [id number] Viewed order from doctor's order list  [name] 03/29/2012, 11:00 PM

## 2012-03-31 DIAGNOSIS — S42309A Unspecified fracture of shaft of humerus, unspecified arm, initial encounter for closed fracture: Secondary | ICD-10-CM | POA: Diagnosis not present

## 2012-04-01 NOTE — ED Provider Notes (Signed)
Medical screening examination/treatment/procedure(s) were performed by non-physician practitioner and as supervising physician I was immediately available for consultation/collaboration.  Ethelda Chick, MD 04/01/12 [phone]

## 2012-04-10 DIAGNOSIS — S42309A Unspecified fracture of shaft of humerus, unspecified arm, initial encounter for closed fracture: Secondary | ICD-10-CM | POA: Diagnosis not present

## 2012-04-16 DIAGNOSIS — M47817 Spondylosis without myelopathy or radiculopathy, lumbosacral region: Secondary | ICD-10-CM | POA: Diagnosis not present

## 2012-04-24 DIAGNOSIS — S42309A Unspecified fracture of shaft of humerus, unspecified arm, initial encounter for closed fracture: Secondary | ICD-10-CM | POA: Diagnosis not present

## 2012-05-06 DIAGNOSIS — S42309A Unspecified fracture of shaft of humerus, unspecified arm, initial encounter for closed fracture: Secondary | ICD-10-CM | POA: Diagnosis not present

## 2012-05-08 DIAGNOSIS — S42309A Unspecified fracture of shaft of humerus, unspecified arm, initial encounter for closed fracture: Secondary | ICD-10-CM | POA: Diagnosis not present

## 2012-05-13 DIAGNOSIS — M47817 Spondylosis without myelopathy or radiculopathy, lumbosacral region: Secondary | ICD-10-CM | POA: Diagnosis not present

## 2012-05-13 DIAGNOSIS — S42309A Unspecified fracture of shaft of humerus, unspecified arm, initial encounter for closed fracture: Secondary | ICD-10-CM | POA: Diagnosis not present

## 2012-05-16 DIAGNOSIS — S42309A Unspecified fracture of shaft of humerus, unspecified arm, initial encounter for closed fracture: Secondary | ICD-10-CM | POA: Diagnosis not present

## 2012-05-21 DIAGNOSIS — M47817 Spondylosis without myelopathy or radiculopathy, lumbosacral region: Secondary | ICD-10-CM | POA: Diagnosis not present

## 2012-05-21 DIAGNOSIS — S42309A Unspecified fracture of shaft of humerus, unspecified arm, initial encounter for closed fracture: Secondary | ICD-10-CM | POA: Diagnosis not present

## 2012-05-23 DIAGNOSIS — S42309A Unspecified fracture of shaft of humerus, unspecified arm, initial encounter for closed fracture: Secondary | ICD-10-CM | POA: Diagnosis not present

## 2012-05-26 DIAGNOSIS — M47817 Spondylosis without myelopathy or radiculopathy, lumbosacral region: Secondary | ICD-10-CM | POA: Diagnosis not present

## 2012-05-29 DIAGNOSIS — M47817 Spondylosis without myelopathy or radiculopathy, lumbosacral region: Secondary | ICD-10-CM | POA: Diagnosis not present

## 2012-05-29 DIAGNOSIS — S42309A Unspecified fracture of shaft of humerus, unspecified arm, initial encounter for closed fracture: Secondary | ICD-10-CM | POA: Diagnosis not present

## 2012-06-02 DIAGNOSIS — S42309A Unspecified fracture of shaft of humerus, unspecified arm, initial encounter for closed fracture: Secondary | ICD-10-CM | POA: Diagnosis not present

## 2012-06-05 DIAGNOSIS — S42309A Unspecified fracture of shaft of humerus, unspecified arm, initial encounter for closed fracture: Secondary | ICD-10-CM | POA: Diagnosis not present

## 2012-06-16 DIAGNOSIS — M47817 Spondylosis without myelopathy or radiculopathy, lumbosacral region: Secondary | ICD-10-CM | POA: Diagnosis not present

## 2012-06-16 DIAGNOSIS — S42309A Unspecified fracture of shaft of humerus, unspecified arm, initial encounter for closed fracture: Secondary | ICD-10-CM | POA: Diagnosis not present

## 2012-06-18 DIAGNOSIS — S42309A Unspecified fracture of shaft of humerus, unspecified arm, initial encounter for closed fracture: Secondary | ICD-10-CM | POA: Diagnosis not present

## 2012-06-23 DIAGNOSIS — S42309A Unspecified fracture of shaft of humerus, unspecified arm, initial encounter for closed fracture: Secondary | ICD-10-CM | POA: Diagnosis not present

## 2012-06-23 DIAGNOSIS — M47817 Spondylosis without myelopathy or radiculopathy, lumbosacral region: Secondary | ICD-10-CM | POA: Diagnosis not present

## 2012-06-25 DIAGNOSIS — M47817 Spondylosis without myelopathy or radiculopathy, lumbosacral region: Secondary | ICD-10-CM | POA: Diagnosis not present

## 2012-06-25 DIAGNOSIS — S42309A Unspecified fracture of shaft of humerus, unspecified arm, initial encounter for closed fracture: Secondary | ICD-10-CM | POA: Diagnosis not present

## 2012-06-30 DIAGNOSIS — M47817 Spondylosis without myelopathy or radiculopathy, lumbosacral region: Secondary | ICD-10-CM | POA: Diagnosis not present

## 2012-06-30 DIAGNOSIS — S42309A Unspecified fracture of shaft of humerus, unspecified arm, initial encounter for closed fracture: Secondary | ICD-10-CM | POA: Diagnosis not present

## 2012-07-03 DIAGNOSIS — S42309A Unspecified fracture of shaft of humerus, unspecified arm, initial encounter for closed fracture: Secondary | ICD-10-CM | POA: Diagnosis not present

## 2012-07-10 DIAGNOSIS — S42309A Unspecified fracture of shaft of humerus, unspecified arm, initial encounter for closed fracture: Secondary | ICD-10-CM | POA: Diagnosis not present

## 2012-07-14 DIAGNOSIS — S42309A Unspecified fracture of shaft of humerus, unspecified arm, initial encounter for closed fracture: Secondary | ICD-10-CM | POA: Diagnosis not present

## 2012-07-15 DIAGNOSIS — S42309A Unspecified fracture of shaft of humerus, unspecified arm, initial encounter for closed fracture: Secondary | ICD-10-CM | POA: Diagnosis not present

## 2012-07-15 DIAGNOSIS — M47817 Spondylosis without myelopathy or radiculopathy, lumbosacral region: Secondary | ICD-10-CM | POA: Diagnosis not present

## 2012-07-22 DIAGNOSIS — S42309A Unspecified fracture of shaft of humerus, unspecified arm, initial encounter for closed fracture: Secondary | ICD-10-CM | POA: Diagnosis not present

## 2012-07-24 DIAGNOSIS — S42309A Unspecified fracture of shaft of humerus, unspecified arm, initial encounter for closed fracture: Secondary | ICD-10-CM | POA: Diagnosis not present

## 2012-07-31 DIAGNOSIS — S42309A Unspecified fracture of shaft of humerus, unspecified arm, initial encounter for closed fracture: Secondary | ICD-10-CM | POA: Diagnosis not present

## 2012-08-11 DIAGNOSIS — S42309A Unspecified fracture of shaft of humerus, unspecified arm, initial encounter for closed fracture: Secondary | ICD-10-CM | POA: Diagnosis not present

## 2012-08-11 DIAGNOSIS — M545 Low back pain, unspecified: Secondary | ICD-10-CM | POA: Diagnosis not present

## 2012-10-03 DIAGNOSIS — S42309A Unspecified fracture of shaft of humerus, unspecified arm, initial encounter for closed fracture: Secondary | ICD-10-CM | POA: Diagnosis not present

## 2012-10-03 DIAGNOSIS — M19019 Primary osteoarthritis, unspecified shoulder: Secondary | ICD-10-CM | POA: Diagnosis not present

## 2012-10-15 DIAGNOSIS — M545 Low back pain, unspecified: Secondary | ICD-10-CM | POA: Diagnosis not present

## 2012-10-17 DIAGNOSIS — M545 Low back pain, unspecified: Secondary | ICD-10-CM | POA: Diagnosis not present

## 2012-10-17 DIAGNOSIS — M5126 Other intervertebral disc displacement, lumbar region: Secondary | ICD-10-CM | POA: Diagnosis not present

## 2012-10-21 DIAGNOSIS — M5126 Other intervertebral disc displacement, lumbar region: Secondary | ICD-10-CM | POA: Diagnosis not present

## 2012-10-21 DIAGNOSIS — M545 Low back pain, unspecified: Secondary | ICD-10-CM | POA: Diagnosis not present

## 2012-10-23 DIAGNOSIS — M5126 Other intervertebral disc displacement, lumbar region: Secondary | ICD-10-CM | POA: Diagnosis not present

## 2012-10-28 DIAGNOSIS — M545 Low back pain, unspecified: Secondary | ICD-10-CM | POA: Diagnosis not present

## 2012-10-31 DIAGNOSIS — M545 Low back pain, unspecified: Secondary | ICD-10-CM | POA: Diagnosis not present

## 2012-11-04 DIAGNOSIS — M545 Low back pain, unspecified: Secondary | ICD-10-CM | POA: Diagnosis not present

## 2012-11-06 DIAGNOSIS — M545 Low back pain, unspecified: Secondary | ICD-10-CM | POA: Diagnosis not present

## 2012-11-10 DIAGNOSIS — M545 Low back pain, unspecified: Secondary | ICD-10-CM | POA: Diagnosis not present

## 2013-02-13 ENCOUNTER — Observation Stay (HOSPITAL_COMMUNITY)
Admission: EM | Admit: 2013-02-13 | Discharge: 2013-02-14 | Disposition: A | Discharge status: Home / Self Care | Payer: Medicare Other | Attending: Family Medicine | Admitting: Family Medicine

## 2013-02-13 ENCOUNTER — Emergency Department (HOSPITAL_COMMUNITY): Payer: Medicare Other

## 2013-02-13 ENCOUNTER — Emergency Department (INDEPENDENT_AMBULATORY_CARE_PROVIDER_SITE_OTHER)
Admission: EM | Admit: 2013-02-13 | Discharge: 2013-02-13 | Disposition: A | Discharge status: Other Acute Inpatient Hospital | Payer: Medicare Other | Source: Home / Self Care | Attending: Family Medicine | Admitting: Family Medicine

## 2013-02-13 ENCOUNTER — Encounter (HOSPITAL_COMMUNITY): Payer: Self-pay

## 2013-02-13 ENCOUNTER — Encounter (HOSPITAL_COMMUNITY): Payer: Self-pay | Admitting: Emergency Medicine

## 2013-02-13 DIAGNOSIS — E669 Obesity, unspecified: Secondary | ICD-10-CM

## 2013-02-13 DIAGNOSIS — R079 Chest pain, unspecified: Secondary | ICD-10-CM | POA: Diagnosis not present

## 2013-02-13 DIAGNOSIS — I209 Angina pectoris, unspecified: Secondary | ICD-10-CM | POA: Diagnosis not present

## 2013-02-13 DIAGNOSIS — R0789 Other chest pain: Secondary | ICD-10-CM

## 2013-02-13 DIAGNOSIS — G8929 Other chronic pain: Secondary | ICD-10-CM

## 2013-02-13 DIAGNOSIS — M545 Low back pain, unspecified: Secondary | ICD-10-CM | POA: Diagnosis not present

## 2013-02-13 DIAGNOSIS — R112 Nausea with vomiting, unspecified: Secondary | ICD-10-CM | POA: Diagnosis not present

## 2013-02-13 DIAGNOSIS — Z6837 Body mass index (BMI) 37.0-37.9, adult: Secondary | ICD-10-CM | POA: Diagnosis not present

## 2013-02-13 DIAGNOSIS — K219 Gastro-esophageal reflux disease without esophagitis: Secondary | ICD-10-CM | POA: Diagnosis not present

## 2013-02-13 DIAGNOSIS — Z79899 Other long term (current) drug therapy: Secondary | ICD-10-CM | POA: Insufficient documentation

## 2013-02-13 DIAGNOSIS — Z23 Encounter for immunization: Secondary | ICD-10-CM | POA: Diagnosis not present

## 2013-02-13 DIAGNOSIS — I208 Other forms of angina pectoris: Secondary | ICD-10-CM

## 2013-02-13 HISTORY — DX: Gastro-esophageal reflux disease without esophagitis: K21.9

## 2013-02-13 LAB — TROPONIN I: Troponin I: <0.3 ng/mL (ref <0.30)

## 2013-02-13 LAB — CBC
Hemoglobin: 13.8 g/dL (ref 12.0–15.0)
Hemoglobin: 13.3 g/dL (ref 12.0–15.0)
MCH: 30.7 pg (ref 26.0–34.0)
MCH: 30.4 pg (ref 26.0–34.0)
MCHC: 34.7 g/dL (ref 30.0–36.0)
MCV: 89 fL (ref 78.0–100.0)
Platelets: 238 10*3/uL (ref 150–400)
RBC: 4.37 MIL/uL (ref 3.87–5.11)
RDW: 12.7 % (ref 11.5–15.5)

## 2013-02-13 LAB — BASIC METABOLIC PANEL
Calcium: 9.2 mg/dL (ref 8.4–10.5)
GFR calc Af Amer: >90 mL/min (ref >90)
GFR calc non Af Amer: 89 mL/min — ABNORMAL LOW (ref >90)
Glucose, Bld: 92 mg/dL (ref 70–99)
Potassium: 3.9 mEq/L (ref 3.5–5.1)
Sodium: 138 mEq/L (ref 135–145)

## 2013-02-13 LAB — CREATININE, SERUM
Creatinine, Ser: 0.74 mg/dL (ref 0.50–1.10)
GFR calc Af Amer: >90 mL/min (ref >90)

## 2013-02-13 MED ORDER — ONDANSETRON HCL 4 MG/2ML IJ SOLN
INTRAMUSCULAR | Status: AC
  Filled 2013-02-13: qty 2

## 2013-02-13 MED ORDER — HYDROCODONE-ACETAMINOPHEN 5-325 MG PO TABS: 1.0000 | ORAL_TABLET | Freq: Four times a day (QID) | ORAL | Status: DC | PRN

## 2013-02-13 MED ORDER — ALUM & MAG HYDROXIDE-SIMETH 200-200-20 MG/5ML PO SUSP: 30.0000 mL | Freq: Four times a day (QID) | ORAL | Status: DC | PRN

## 2013-02-13 MED ORDER — SODIUM CHLORIDE 0.9 % IV SOLN
INTRAVENOUS | Status: DC
  Administered 2013-02-13: 21:00:00 via INTRAVENOUS

## 2013-02-13 MED ORDER — ONDANSETRON HCL 4 MG PO TABS: 4.0000 mg | ORAL_TABLET | Freq: Four times a day (QID) | ORAL | Status: DC | PRN

## 2013-02-13 MED ORDER — ONDANSETRON HCL 4 MG/2ML IJ SOLN
4.0000 mg | Freq: Once | INTRAMUSCULAR | Status: AC
  Administered 2013-02-13: 4 mg via INTRAVENOUS

## 2013-02-13 MED ORDER — INFLUENZA VAC SPLIT QUAD 0.5 ML IM SUSP
0.5000 mL | INTRAMUSCULAR | Status: AC
  Filled 2013-02-13: qty 0.5

## 2013-02-13 MED ORDER — PANTOPRAZOLE SODIUM 40 MG PO TBEC
40.0000 mg | DELAYED_RELEASE_TABLET | Freq: Every day | ORAL | Status: DC
  Administered 2013-02-13: 40 mg via ORAL
  Filled 2013-02-13: qty 1

## 2013-02-13 MED ORDER — NITROGLYCERIN 0.4 MG SL SUBL
0.4000 mg | SUBLINGUAL_TABLET | SUBLINGUAL | Status: DC | PRN
  Administered 2013-02-13: 0.4 mg via SUBLINGUAL
  Filled 2013-02-13: qty 25

## 2013-02-13 MED ORDER — PNEUMOCOCCAL VAC POLYVALENT 25 MCG/0.5ML IJ INJ
0.5000 mL | INJECTION | INTRAMUSCULAR | Status: AC
  Filled 2013-02-13: qty 0.5

## 2013-02-13 MED ORDER — SODIUM CHLORIDE 0.9 % IV SOLN
Freq: Once | INTRAVENOUS | Status: AC
  Administered 2013-02-13: 12:00:00 via INTRAVENOUS

## 2013-02-13 MED ORDER — ACETAMINOPHEN 325 MG PO TABS: 650.0000 mg | ORAL_TABLET | Freq: Four times a day (QID) | ORAL | Status: DC | PRN

## 2013-02-13 MED ORDER — ASPIRIN 325 MG PO TABS
325.0000 mg | ORAL_TABLET | ORAL | Status: AC
  Administered 2013-02-13: 325 mg via ORAL
  Filled 2013-02-13: qty 1

## 2013-02-13 MED ORDER — CYCLOBENZAPRINE HCL 5 MG PO TABS
5.0000 mg | ORAL_TABLET | Freq: Two times a day (BID) | ORAL | Status: DC
  Administered 2013-02-13: 5 mg via ORAL
  Filled 2013-02-13 (×3): qty 1

## 2013-02-13 MED ORDER — ACETAMINOPHEN 650 MG RE SUPP: 650.0000 mg | Freq: Four times a day (QID) | RECTAL | Status: DC | PRN

## 2013-02-13 MED ORDER — ASPIRIN 325 MG PO TABS
325.0000 mg | ORAL_TABLET | Freq: Every day | ORAL | Status: DC
  Filled 2013-02-13 (×2): qty 1

## 2013-02-13 MED ORDER — MORPHINE SULFATE 2 MG/ML IJ SOLN: 2.0000 mg | INTRAMUSCULAR | Status: DC | PRN

## 2013-02-13 MED ORDER — ONDANSETRON HCL 4 MG/2ML IJ SOLN: 4.0000 mg | Freq: Four times a day (QID) | INTRAMUSCULAR | Status: DC | PRN

## 2013-02-13 MED ORDER — ENOXAPARIN SODIUM 40 MG/0.4ML ~~LOC~~ SOLN
40.0000 mg | SUBCUTANEOUS | Status: DC
  Administered 2013-02-13: 40 mg via SUBCUTANEOUS
  Filled 2013-02-13 (×2): qty 0.4

## 2013-02-13 MED ORDER — SODIUM CHLORIDE 0.9 % IJ SOLN
3.0000 mL | Freq: Two times a day (BID) | INTRAMUSCULAR | Status: DC
  Administered 2013-02-13: 3 mL via INTRAVENOUS

## 2013-02-13 NOTE — ED Notes (Signed)
Patient transported to X-ray 

## 2013-02-13 NOTE — ED Provider Notes (Signed)
CSN: [id number]     Arrival date & time 02/13/13  1124 History   First MD Initiated Contact with Patient 02/13/13 1143     Chief Complaint  Patient presents with  . Chest Pain   (Consider location/radiation/quality/duration/timing/severity/associated sxs/prior Treatment) Patient is a 67 y.o. female presenting with chest pain. The history is provided by the patient and the spouse.  Chest Pain Pain location:  Substernal area Pain quality: sharp   Pain radiates to:  Does not radiate Pain radiates to the back: no   Pain severity:  Mild Onset quality:  Sudden Progression:  Unchanged Chronicity:  New Relieved by:  Nothing Ineffective treatments:  None tried Associated symptoms: nausea and vomiting   Associated symptoms: no abdominal pain, no palpitations and no shortness of breath   Risk factors: no diabetes mellitus, no high cholesterol, no hypertension and no smoking     Past Medical History  Diagnosis Date  . Chronic back pain   . Carpal tunnel syndrome   . GERD (gastroesophageal reflux disease)    Past Surgical History  Procedure Laterality Date  . Back surgery    . Carpal tunnel release     History reviewed. No pertinent family history. History  Substance Use Topics  . Smoking status: Never Smoker   . Smokeless tobacco: Not on file  . Alcohol Use: No   OB History   Grav Para Term Preterm Abortions TAB SAB Ect Mult Living                 Review of Systems  Constitutional: Negative.   Respiratory: Negative for shortness of breath.   Cardiovascular: Positive for chest pain. Negative for palpitations.  Gastrointestinal: Positive for nausea and vomiting. Negative for abdominal pain.    Allergies  Review of patient's allergies indicates no known allergies.  Home Medications   Current Outpatient Rx  Name  Route  Sig  Dispense  Refill  . HYDROcodone-acetaminophen (NORCO/VICODIN) 5-325 MG per tablet   Oral   Take 1 tablet by mouth every 6 (six) hours as needed.  For pain         . omeprazole (PRILOSEC OTC) 20 MG tablet   Oral   Take 20 mg by mouth daily.         . ondansetron (ZOFRAN ODT) 8 MG disintegrating tablet   Oral   Take 1 tablet (8 mg total) by mouth every 8 (eight) hours as needed for nausea.   6 tablet   0   . oxyCODONE-acetaminophen (PERCOCET/ROXICET) 5-325 MG per tablet   Oral   Take 1-2 tablets by mouth every 6 (six) hours as needed for pain.   20 tablet   0    BP 133/71  Pulse 84  Temp(Src) 98.4 F (36.9 C) (Oral)  Resp 18  SpO2 96% Physical Exam  Nursing note and vitals reviewed. Constitutional: She is oriented to person, place, and time. She appears well-developed and well-nourished.  HENT:  Head: Normocephalic.  Mouth/Throat: Oropharynx is clear and moist.  Eyes: Conjunctivae are normal. Pupils are equal, round, and reactive to light.  Neck: Normal range of motion. Neck supple.  Cardiovascular: Normal rate, normal heart sounds and intact distal pulses.   Pulmonary/Chest: Breath sounds normal.  Abdominal: Soft. Bowel sounds are normal. There is no tenderness.  Musculoskeletal: She exhibits no edema.  Lymphadenopathy:    She has no cervical adenopathy.  Neurological: She is alert and oriented to person, place, and time.  Skin: Skin is warm and dry.  ED Course  Procedures (including critical care time) Labs Review Labs Reviewed - No data to display Imaging Review No results found.  MDM   1. Atypical chest pain    Sent for further eval of substernal cp , ecg -wnl., pos fam hx cad.    Linna Hoff, MD 02/13/13 1209

## 2013-02-13 NOTE — ED Notes (Addendum)
States she woke this AM w a vague non-reproduce able pain /discomfort ,nausea and vomtin. Pain was 5, now a 2-3, denies SOB; Dr Artis Flock advised

## 2013-02-13 NOTE — ED Notes (Signed)
[facility] here to transport patient.  

## 2013-02-13 NOTE — ED Notes (Signed)
[facility] called for transport, CN in main ED advised of pending transfer

## 2013-02-13 NOTE — ED Provider Notes (Signed)
I saw and evaluated the patient, reviewed the resident's note and I agree with the findings including ECG interpretation and plan.  New gradual onset today waxing and waning mild to moderate now mild diffuse vague chest discomfort with transient nausea which is now resolved without shortness of breath without lightheadedness, the pain is nonexertional nonpleuritic non-positional with unremarkable initial ECG and no documented history of coronary artery disease according to the patient.  Hurman Horn, MD 02/13/13 [phone]

## 2013-02-13 NOTE — H&P (Signed)
[facility] History and Physical  Courtney Hill JXB:[id number] DOB: [birth year] DOA: 02/13/2013  Referring physician: Eppie Gibson DO PCP: Kaleen Mask, MD    Chief Complaint: Chest Pian  HPI: Courtney Hill is a 67 y.o. female with a past medical history of morbid obesity, gastroesophageal reflux disease, chronic back pain who presents emergency department this afternoon with complaints of chest pain. She notes waking up this morning noting chest discomfort located in the retrosternal region, characterized as sharp stabbing, constant, nonradiating. Approximately 11:00 this morning, she developed nausea and vomiting. She denies shortness of breath, palpitations, diaphoresis, dizziness lightheadedness, presyncope or syncope. Patient denies a history of chest pain. She underwent a stress test back in 2008 as part of preoperative clearance for back surgery. She has not undergone cardiac catheterizations. She reports being able to perform physical exertion without developing chest pain or significant shortness of breath. In the emergency department initial set of cardiac enzymes were negative, EKG did not show ischemic changes. She reported resolution to her chest pain while in emergent apartment. Of note she received both aspirin and nitroglycerin in the ED.  Review of Systems: The patient denies anorexia, fever, weight loss,, vision loss, decreased hearing, hoarseness, syncope, dyspnea on exertion, peripheral edema, balance deficits, hemoptysis, abdominal pain, melena, hematochezia, severe indigestion/heartburn, hematuria, incontinence, genital sores, muscle weakness, suspicious skin lesions, transient blindness, difficulty walking, depression, unusual weight change, abnormal bleeding, enlarged lymph nodes, angioedema, and breast masses.    Past Medical History  Diagnosis Date  . Chronic back pain   . Carpal tunnel syndrome   . GERD (gastroesophageal reflux disease)    Past Surgical  History  Procedure Laterality Date  . Back surgery    . Carpal tunnel release     Social History:  reports that she has never smoked. She does not have any smokeless tobacco history on file. She reports that she does not drink alcohol or use illicit drugs. Patient is currently married, she has 6 children, retired. She does not drink or smoke.  No Known Allergies  No family history on file. She reports her brother had a myocardial infarction in his 84s. Both her parents died in a motor vehicle accident.  Prior to Admission medications   Medication Sig Start Date End Date Taking? Authorizing Provider  cyclobenzaprine (FLEXERIL) 5 MG tablet Take 5 mg by mouth 2 (two) times daily. 11/06/12  Yes Historical Provider, MD  HYDROcodone-acetaminophen (NORCO/VICODIN) 5-325 MG per tablet Take 1 tablet by mouth every 6 (six) hours as needed. For pain   Yes Historical Provider, MD  meloxicam (MOBIC) 15 MG tablet Take 15 mg by mouth daily. 01/20/13  Yes Historical Provider, MD  omeprazole (PRILOSEC OTC) 20 MG tablet Take 20 mg by mouth daily.   Yes Historical Provider, MD   Physical Exam: Filed Vitals:   02/13/13 1253  BP: 118/52  Temp: 98.2 F (36.8 C)  Resp: 18     General:  Awake alert oriented, in no acute distress. She is presently chest pain-free.  Eyes: Pupils are equal round reactive to light extraocular movement intact  Neck: Neck is supple symmetrical no jugular venous distention or carotid bruits  Cardiovascular: Regular rate and rhythm normal S1-S2 no murmurs rubs or gallops  Respiratory: Lungs are clear to auscultation bilaterally no wheezing rhonchi or rales  Abdomen: Obese soft nontender nondistended positive bowel  Skin: No rashes or lesions noted  Musculoskeletal: Patient did not have chest pain reproducible to palpation.  Psychiatric: Awake alert and  oriented x3  Neurologic: Cranial nerves 2-12 intact, no alteration to sensation, global 5 out of 5 muscle  Labs on  Admission:  Basic Metabolic Panel:  Recent Labs Lab 02/13/13 1500  NA 138  K 3.9  CL 103  CO2 24  GLUCOSE 92  BUN 15  CREATININE 0.66  CALCIUM 9.2   Liver Function Tests: No results found for this basename: AST, ALT, ALKPHOS, BILITOT, PROT, ALBUMIN,  in the last 168 hours No results found for this basename: LIPASE, AMYLASE,  in the last 168 hours No results found for this basename: AMMONIA,  in the last 168 hours CBC:  Recent Labs Lab 02/13/13 1500  WBC 10.1  HGB 13.8  HCT 39.8  MCV 88.6  PLT 238   Cardiac Enzymes: No results found for this basename: CKTOTAL, CKMB, CKMBINDEX, TROPONINI,  in the last 168 hours  BNP (last 3 results) No results found for this basename: PROBNP,  in the last 8760 hours CBG: No results found for this basename: GLUCAP,  in the last 168 hours  Radiological Exams on Admission: Dg Chest 2 View  02/13/2013   CLINICAL DATA:  67 year old female with mid chest pain nausea and vomiting.  EXAM: CHEST  2 VIEW  COMPARISON:  11/26/2006.  FINDINGS: Improved lung volumes. Normal cardiac size and mediastinal contours. Visualized tracheal air column is within normal limits. No pneumothorax, pulmonary edema, pleural effusion or confluent pulmonary opacity. No acute osseous abnormality identified.  IMPRESSION: No active cardiopulmonary disease.   Electronically Signed   By: Augusto Gamble M.D.   On: 02/13/2013 14:07    EKG:   Assessment/Plan Active Problems:   Chest pain   GERD (gastroesophageal reflux disease)   Chronic lower back pain   Obesity    Chest pain Patient presenting with chest pain having atypical features. Initial set of cardiac enzymes were negative with EKG not showing ischemic changes. I discussed case with Dr. Jens Som cardiology who agreed with a noninvasive study with a stress test. Will place patient in overnight observation, with continuous cardiac monitoring, cycle cardiac enzymes x3 sets. Will obtain a fasting lipid panel for risk  stratification. Place patient on aspirin therapy 325 mg by mouth daily. Other possibilities for her chest and just chest pain include gastroesophageal reflux disease. Will start her on a PPI as well.  Gastroesophageal reflux disease Is conceivable that chest pain could be secondary to gastroesophageal reflux disease. Will place her Protonix 40 mg by mouth daily  Chronic back pain Patient with history of chronic back pain, continue home regimen of Percocet 5/325 every 6 hours as needed  Nutrition: Initially placed on a heart healthy diet, n.p.o. after midnight  DVT prophylaxis Lovenox 40 mg subcutaneous daily  I discussed case with Dr. Jens Som, notified him that patient would undergo stress testing tomorrow morning.  Code Status: Full code Family Communication: Plan discussed with patient at bedside Disposition Plan: Patient will be placed in observation, I do not anticipate her requiring greater than 2 night hospitalization  Time spent: 60 min  Jeralyn Bennett [facility] Pager [phone] If 7PM-7AM, please contact night-coverage [email or web address] Password [facility] 02/13/2013, 6:06 PM

## 2013-02-13 NOTE — ED Notes (Signed)
Per [facility] pt came from UC where she c/o sharp central CP 2/10, non radiating, that started this AM. Pt also c/o nausea and vomiting associated with CP. UC gave pt 4mg  IV zofran and NS.

## 2013-02-13 NOTE — ED Provider Notes (Signed)
CSN: [id number]     Arrival date & time 02/13/13  1248 History   First MD Initiated Contact with Patient 02/13/13 1304     Chief Complaint  Patient presents with  . Chest Pain  . Nausea   HPI Comments: Pt is a 67 y/o female female with PMHx for GERD and Chronic back pain who presents to the ED after being sent from urgent care due to ongoing atypical chest pain.  Pt states she was in her normal state of health up until this AM when she woke up with vague substernal chest pain/pressure.  Pt didn't think too much of this as she was nauseated and had one episode of vomitus throughout the morning.  Her chest pain is described as vague, substernal, sharp at times, not alleviated by rest/nitro, and not exacerbated by activity.  No radiation into her arms, jaw, or diaphoresis.  She denies any recent changes to her activities, recent travel, recent sick contacts, HA, blurred vision, diplopia, dysphagia, SOB, abdominal pain, diarrhea, constipation, lower extremity edema, weakness, numbness, tingling or burning of her extremity.  Pt does not have hx of HTN, recent anticoagulant use, known CAD, hyperlipidemia, current or previous smoker, alcohol use.  She does have one brother who had MI at age of 86 but otherwise no family history of cardiac disease.    Patient is a 67 y.o. female presenting with chest pain.  Chest Pain Associated symptoms: nausea and vomiting   Associated symptoms: no abdominal pain, no cough, no dizziness, no fatigue, no fever, no headache, no numbness, no palpitations, no shortness of breath and no weakness     Past Medical History  Diagnosis Date  . Chronic back pain   . Carpal tunnel syndrome   . GERD (gastroesophageal reflux disease)    Past Surgical History  Procedure Laterality Date  . Back surgery    . Carpal tunnel release     No family history on file. History  Substance Use Topics  . Smoking status: Never Smoker   . Smokeless tobacco: Not on file  . Alcohol Use: No    OB History   Grav Para Term Preterm Abortions TAB SAB Ect Mult Living                 Review of Systems  Constitutional: Negative for fever, activity change and fatigue.  HENT: Negative for sore throat and neck pain.   Eyes: Negative for photophobia and visual disturbance.  Respiratory: Positive for chest tightness. Negative for cough and shortness of breath.   Cardiovascular: Positive for chest pain. Negative for palpitations and leg swelling.  Gastrointestinal: Positive for nausea and vomiting. Negative for abdominal pain, diarrhea and constipation.  Endocrine: Negative.   Genitourinary: Negative.   Musculoskeletal: Negative.   Neurological: Negative for dizziness, syncope, weakness, numbness and headaches.    Allergies  Review of patient's allergies indicates no known allergies.  Home Medications   Current Outpatient Rx  Name  Route  Sig  Dispense  Refill  . cyclobenzaprine (FLEXERIL) 5 MG tablet   Oral   Take 5 mg by mouth 2 (two) times daily.         Marland Kitchen HYDROcodone-acetaminophen (NORCO/VICODIN) 5-325 MG per tablet   Oral   Take 1 tablet by mouth every 6 (six) hours as needed. For pain         . meloxicam (MOBIC) 15 MG tablet   Oral   Take 15 mg by mouth daily.         Marland Kitchen  omeprazole (PRILOSEC OTC) 20 MG tablet   Oral   Take 20 mg by mouth daily.          BP 118/52  Temp(Src) 98.2 F (36.8 C) (Oral)  Resp 18  SpO2 98% Physical Exam  Constitutional: She is oriented to person, place, and time. She appears well-developed and well-nourished. No distress.  HENT:  Head: Normocephalic and atraumatic.  Eyes: Conjunctivae are normal. Pupils are equal, round, and reactive to light.  Neck: Normal range of motion. Neck supple.  Cardiovascular: Normal rate, regular rhythm and normal heart sounds.   No murmur heard. Pulmonary/Chest: Effort normal and breath sounds normal. No respiratory distress. She has no rales. She exhibits no tenderness.  Abdominal: Soft.  Bowel sounds are normal. She exhibits no distension and no mass. There is no tenderness. There is no rebound and no guarding.  Musculoskeletal: Normal range of motion.  Neurological: She is alert and oriented to person, place, and time. No cranial nerve deficit.  Skin: Skin is warm and dry. She is not diaphoretic.  Psychiatric: Her behavior is normal.    ED Course  Procedures (including critical care time)  Date: 02/13/2013  Rate: 74  Rhythm: normal sinus rhythm  QRS Axis: normal  Intervals: normal  ST/T Wave abnormalities: Borderline TWI V1/V2  Conduction Disutrbances: none  Narrative Interpretation: Normal Sinus EKG  Old EKG Reviewed: No significant changes noted  Labs Review Labs Reviewed  BASIC METABOLIC PANEL - Abnormal; Notable for the following:    GFR calc non Af Amer 89 (*)    All other components within normal limits  CBC  POCT I-STAT TROPONIN I   Imaging Review Dg Chest 2 View  02/13/2013   CLINICAL DATA:  67 year old female with mid chest pain nausea and vomiting.  EXAM: CHEST  2 VIEW  COMPARISON:  11/26/2006.  FINDINGS: Improved lung volumes. Normal cardiac size and mediastinal contours. Visualized tracheal air column is within normal limits. No pneumothorax, pulmonary edema, pleural effusion or confluent pulmonary opacity. No acute osseous abnormality identified.  IMPRESSION: No active cardiopulmonary disease.   Electronically Signed   By: Augusto Gamble M.D.   On: 02/13/2013 14:07    MDM   1. Angina at rest   Pt with vague substernal CP that has been ongoing for the past 6 hours.  Pt states her pain has improved over the last couple of hours but still there.  Her TIMI score is 1, and she has RF of family history of CAD, otherwise negative.  EKG looks unchanged from previous without acute ST elevation.  Will get POC troponin, CXR, BMP, CBC.  Given ASA 324 mg and Nitro PRN.  Due to ongoing chest pain, age, and atypical angina, will evaluate after her labs are final for  possible observation for chest pain r/o.    3:17 PM - POC troponin negative, CBC unremarkable.  Pt states the nitro/ASA did help alleviate her Sx, pain free currently.     4:33 PM - Pt to be admitted overnight for observation and cardiac w/u.  Spoke with Dr. Vanessa Barbara, [facility] hospital[facility] who will evaluate and admit.     Twana First Paulina Fusi, DO of [facility] 02/13/2013, 4:42 PM     Briscoe Deutscher, DO 02/13/13 1642

## 2013-02-14 ENCOUNTER — Observation Stay (HOSPITAL_COMMUNITY): Payer: Medicare Other

## 2013-02-14 ENCOUNTER — Other Ambulatory Visit (HOSPITAL_COMMUNITY): Payer: Federal, State, Local not specified - PPO

## 2013-02-14 DIAGNOSIS — I209 Angina pectoris, unspecified: Secondary | ICD-10-CM

## 2013-02-14 DIAGNOSIS — R079 Chest pain, unspecified: Secondary | ICD-10-CM | POA: Diagnosis not present

## 2013-02-14 LAB — LIPID PANEL
HDL: 40 mg/dL (ref >39)
LDL Cholesterol: 76 mg/dL (ref 0–99)
Triglycerides: 224 mg/dL — ABNORMAL HIGH (ref <150)
VLDL: 45 mg/dL — ABNORMAL HIGH (ref 0–40)

## 2013-02-14 LAB — BASIC METABOLIC PANEL
BUN: 18 mg/dL (ref 6–23)
Creatinine, Ser: 0.82 mg/dL (ref 0.50–1.10)
GFR calc non Af Amer: 72 mL/min — ABNORMAL LOW (ref >90)
Glucose, Bld: 100 mg/dL — ABNORMAL HIGH (ref 70–99)
Potassium: 3.9 mEq/L (ref 3.5–5.1)

## 2013-02-14 LAB — CBC
HCT: 37.1 % (ref 36.0–46.0)
Hemoglobin: 12.3 g/dL (ref 12.0–15.0)
MCH: 30 pg (ref 26.0–34.0)
MCHC: 33.2 g/dL (ref 30.0–36.0)

## 2013-02-14 MED ORDER — PNEUMOCOCCAL VAC POLYVALENT 25 MCG/0.5ML IJ INJ
0.5000 mL | INJECTION | Freq: Once | INTRAMUSCULAR | Status: AC
  Administered 2013-02-14: 0.5 mL via INTRAMUSCULAR
  Filled 2013-02-14: qty 0.5

## 2013-02-14 MED ORDER — REGADENOSON 0.4 MG/5ML IV SOLN
0.4000 mg | Freq: Once | INTRAVENOUS | Status: AC
  Administered 2013-02-14: 0.4 mg via INTRAVENOUS

## 2013-02-14 MED ORDER — TECHNETIUM TC 99M SESTAMIBI - CARDIOLITE
10.0000 | Freq: Once | INTRAVENOUS | Status: AC | PRN
  Administered 2013-02-14: 10 via INTRAVENOUS

## 2013-02-14 MED ORDER — TECHNETIUM TC 99M SESTAMIBI - CARDIOLITE
30.0000 | Freq: Once | INTRAVENOUS | Status: AC | PRN
  Administered 2013-02-14: 30 via INTRAVENOUS

## 2013-02-14 MED ORDER — INFLUENZA VAC SPLIT QUAD 0.5 ML IM SUSP
0.5000 mL | Freq: Once | INTRAMUSCULAR | Status: AC
  Administered 2013-02-14: 0.5 mL via INTRAMUSCULAR
  Filled 2013-02-14: qty 0.5

## 2013-02-14 MED ORDER — REGADENOSON 0.4 MG/5ML IV SOLN
INTRAVENOUS | Status: AC
  Filled 2013-02-14: qty 5

## 2013-02-14 MED ORDER — PANTOPRAZOLE SODIUM 40 MG PO TBEC: 40.0000 mg | DELAYED_RELEASE_TABLET | Freq: Every day | ORAL | Status: DC

## 2013-02-14 MED ORDER — ASPIRIN 325 MG PO TABS: 325.0000 mg | ORAL_TABLET | Freq: Every day | ORAL | Status: DC

## 2013-02-14 NOTE — Discharge Summary (Signed)
Physician Discharge Summary  Courtney Hill XBJ:[id number] DOB: 08-Sep-1945 DOA: 02/13/2013  PCP: Kaleen Mask, MD  Admit date: 02/13/2013 Discharge date: 02/14/2013  Time spent: 20 minutes  Recommendations for Outpatient Follow-up:  1. Please followup care with Dr. Jeannetta Nap in about one week  2. Recommended patient to start aspirin and TUMS daily   Discharge Diagnoses:  Active Problems:   Chest pain   GERD (gastroesophageal reflux disease)   Chronic lower back pain   Obesity   Discharge Condition: Good  Diet recommendation: Heart healthy  Filed Weights   02/13/13 1910  Weight: 86.5 kg (190 lb 11.2 oz)    History of present illness:  67 year old female presented with chest pain emergency room 9 12 2014.  Given elevated heart score, patient admitted and ruled out for cardiac issues with cardiac enzymes, cardiology discussed with admitting physician stress test which was performed and showed an EF of 83% with no specific wall motion abnormalities Ashby Dawes of the pain suspicious for GERD as patient has had some past Patient counseled on use of TUMS, Prilosec over-the-counter prescribed, as well as aspirin over-the-counter-given she has some ASCVD risk and use of aspirin in patients over the age of 68 does decrease CVAs for primary prevention   Procedures:  Nuclear stress test   Consultations:  Telephone consult with cardiology  Discharge Exam: Filed Vitals:   02/14/13 1434  BP: 113/54  Pulse: 69  Temp: 97.6 F (36.4 C)  Resp: 18   Any other General: Alert pleasant oriented no chest pain Cardiovascular: S1-S2 no murmur or gallop Respiratory: Clinically clear   Discharge Instructions  Discharge Orders   Future Orders Complete By Expires   Diet - low sodium heart healthy  As directed    Discharge instructions  As directed    Comments:     You were cared for by a hospitalist during your hospital stay. If you have any questions about your discharge medications  or the care you received while you were in the hospital after you are discharged, you can call the unit and asked to speak with the hospitalist on call if the hospitalist that took care of you is not available. Once you are discharged, your primary care physician will handle any further medical issues. Please note that NO REFILLS for any discharge medications will be authorized once you are discharged, as it is imperative that you return to your primary care physician (or establish a relationship with a primary care physician if you do not have one) for your aftercare needs so that they can reassess your need for medications and monitor your lab values. If you do not have a primary care physician, you can call [phone] for a physician referral.   Increase activity slowly  As directed        Medication List    STOP taking these medications       meloxicam 15 MG tablet  Commonly known as:  MOBIC     omeprazole 20 MG tablet  Commonly known as:  PRILOSEC OTC      TAKE these medications       aspirin 325 MG tablet  Take 1 tablet (325 mg total) by mouth daily.     cyclobenzaprine 5 MG tablet  Commonly known as:  FLEXERIL  Take 5 mg by mouth 2 (two) times daily.     HYDROcodone-acetaminophen 5-325 MG per tablet  Commonly known as:  NORCO/VICODIN  Take 1 tablet by mouth every 6 (six) hours as needed.  For pain     pantoprazole 40 MG tablet  Commonly known as:  PROTONIX  Take 1 tablet (40 mg total) by mouth daily.       No Known Allergies    The results of significant diagnostics from this hospitalization (including imaging, microbiology, ancillary and laboratory) are listed below for reference.    Significant Diagnostic Studies: Dg Chest 2 View  02/13/2013   CLINICAL DATA:  67 year old female with mid chest pain nausea and vomiting.  EXAM: CHEST  2 VIEW  COMPARISON:  11/26/2006.  FINDINGS: Improved lung volumes. Normal cardiac size and mediastinal contours. Visualized tracheal air  column is within normal limits. No pneumothorax, pulmonary edema, pleural effusion or confluent pulmonary opacity. No acute osseous abnormality identified.  IMPRESSION: No active cardiopulmonary disease.   Electronically Signed   By: Augusto Gamble M.D.   On: 02/13/2013 14:07   Nm Myocar Multi W/spect W/wall Motion / Ef  02/14/2013   *RADIOLOGY REPORT*  Clinical Data:  Chest pain  MYOCARDIAL IMAGING WITH SPECT (REST AND PHARMACOLOGIC-STRESS) GATED LEFT VENTRICULAR WALL MOTION STUDY LEFT VENTRICULAR EJECTION FRACTION  Technique:  Standard myocardial SPECT imaging was performed after resting intravenous injection of 10 mCi Tc-59m sestamibi. Subsequently, intravenous infusion of regadenoson was performed under the supervision of the Cardiology staff.  At peak effect of the drug, 30 mCi Tc-66m sestamibi was injected intravenously and standard myocardial SPECT  imaging was performed.  Quantitative gated imaging was also performed to evaluate left ventricular wall motion, and estimate left ventricular ejection fraction.  Comparison:  Chest x-ray 02/13/2013  Findings:  Evaluation of the gated data yields an end-diastolic volume of 51 ml and an end-systolic volume of 9 ml giving a calculated ejection fraction of 83%.  This is likely exaggerated given the overall small size of the heart.  No ventricular wall motion abnormality is identified.  Normal and symmetric radiotracer uptake throughout the ventricular myocardium.  Minimally decreased uptake in the interventricular septum on the rest and stress views is favored to reflect breast attenuation artifact.  No large fixed defect or evidence of defect on the stress views to suggest inducible ischemia.  IMPRESSION:  1.  No evidence of inducible/reversible ischemia. 2.  Normal ventricular wall motion. 3.  Calculated ejection fraction of 83% is likely exaggerated due to the relatively small cardiac size.   Original Report Authenticated By: Malachy Moan, M.D.     Microbiology: No results found for this or any previous visit (from the past 240 hour(s)).   Labs: Basic Metabolic Panel:  Recent Labs Lab 02/13/13 1500 02/13/13 2037 02/14/13 0520  NA 138  --  138  K 3.9  --  3.9  CL 103  --  105  CO2 24  --  24  GLUCOSE 92  --  100*  BUN 15  --  18  CREATININE 0.66 0.74 0.82  CALCIUM 9.2  --  8.5   Liver Function Tests: No results found for this basename: AST, ALT, ALKPHOS, BILITOT, PROT, ALBUMIN,  in the last 168 hours No results found for this basename: LIPASE, AMYLASE,  in the last 168 hours No results found for this basename: AMMONIA,  in the last 168 hours CBC:  Recent Labs Lab 02/13/13 1500 02/13/13 2037 02/14/13 0520  WBC 10.1 8.9 7.4  HGB 13.8 13.3 12.3  HCT 39.8 38.9 37.1  MCV 88.6 89.0 90.5  PLT 238 239 195   Cardiac Enzymes:  Recent Labs Lab 02/13/13 2036 02/14/13 0140 02/14/13 0730  TROPONINI <  0.30 <0.30 <0.30   BNP: BNP (last 3 results)  Recent Labs  02/13/13 2036  PROBNP 18.8   CBG: No results found for this basename: GLUCAP,  in the last 168 hours     Signed:  Rhetta Mura  [facility] 02/14/2013, 3:26 PM

## 2013-02-27 DIAGNOSIS — R079 Chest pain, unspecified: Secondary | ICD-10-CM | POA: Diagnosis not present

## 2014-05-13 ENCOUNTER — Other Ambulatory Visit: Payer: Self-pay | Admitting: Orthopedic Surgery

## 2014-05-31 ENCOUNTER — Inpatient Hospital Stay (HOSPITAL_COMMUNITY)
Admission: RE | Admit: 2014-05-31 | Discharge: 2014-05-31 | Disposition: A | Discharge status: Home / Self Care | Payer: Federal, State, Local not specified - PPO | Source: Ambulatory Visit

## 2014-06-02 ENCOUNTER — Encounter (HOSPITAL_COMMUNITY)
Admission: RE | Admit: 2014-06-02 | Discharge: 2014-06-02 | Disposition: A | Discharge status: Home / Self Care | Source: Ambulatory Visit | Attending: Orthopedic Surgery | Admitting: Orthopedic Surgery

## 2014-06-02 ENCOUNTER — Encounter (HOSPITAL_COMMUNITY): Payer: Self-pay

## 2014-06-02 DIAGNOSIS — M488X6 Other specified spondylopathies, lumbar region: Secondary | ICD-10-CM | POA: Insufficient documentation

## 2014-06-02 DIAGNOSIS — G8929 Other chronic pain: Secondary | ICD-10-CM | POA: Diagnosis not present

## 2014-06-02 DIAGNOSIS — E669 Obesity, unspecified: Secondary | ICD-10-CM | POA: Insufficient documentation

## 2014-06-02 DIAGNOSIS — Z01810 Encounter for preprocedural cardiovascular examination: Secondary | ICD-10-CM | POA: Insufficient documentation

## 2014-06-02 DIAGNOSIS — Z01818 Encounter for other preprocedural examination: Secondary | ICD-10-CM

## 2014-06-02 DIAGNOSIS — K449 Diaphragmatic hernia without obstruction or gangrene: Secondary | ICD-10-CM | POA: Insufficient documentation

## 2014-06-02 DIAGNOSIS — Z01812 Encounter for preprocedural laboratory examination: Secondary | ICD-10-CM | POA: Diagnosis not present

## 2014-06-02 DIAGNOSIS — I517 Cardiomegaly: Secondary | ICD-10-CM | POA: Diagnosis not present

## 2014-06-02 DIAGNOSIS — K219 Gastro-esophageal reflux disease without esophagitis: Secondary | ICD-10-CM | POA: Diagnosis not present

## 2014-06-02 DIAGNOSIS — I252 Old myocardial infarction: Secondary | ICD-10-CM | POA: Diagnosis not present

## 2014-06-02 DIAGNOSIS — R9431 Abnormal electrocardiogram [ECG] [EKG]: Secondary | ICD-10-CM | POA: Diagnosis not present

## 2014-06-02 HISTORY — DX: Personal history of other medical treatment: Z92.89

## 2014-06-02 HISTORY — DX: Personal history of other diseases of the digestive system: Z87.19

## 2014-06-02 HISTORY — DX: Unspecified osteoarthritis, unspecified site: M19.90

## 2014-06-02 LAB — TYPE AND SCREEN
ABO/RH(D): AB POS
ANTIBODY SCREEN: NEGATIVE

## 2014-06-02 LAB — CBC WITH DIFFERENTIAL/PLATELET
BASOS ABS: 0 10*3/uL (ref 0.0–0.1)
BASOS PCT: 0 % (ref 0–1)
EOS PCT: 4 % (ref 0–5)
Eosinophils Absolute: 0.3 10*3/uL (ref 0.0–0.7)
HCT: 41.5 % (ref 36.0–46.0)
HEMOGLOBIN: 13.4 g/dL (ref 12.0–15.0)
LYMPHS PCT: 19 % (ref 12–46)
Lymphs Abs: 1.5 10*3/uL (ref 0.7–4.0)
MCH: 29 pg (ref 26.0–34.0)
MCHC: 32.3 g/dL (ref 30.0–36.0)
MCV: 89.8 fL (ref 78.0–100.0)
MONO ABS: 0.5 10*3/uL (ref 0.1–1.0)
MONOS PCT: 6 % (ref 3–12)
NEUTROS PCT: 71 % (ref 43–77)
Neutro Abs: 5.8 10*3/uL (ref 1.7–7.7)
PLATELETS: 277 10*3/uL (ref 150–400)
RBC: 4.62 MIL/uL (ref 3.87–5.11)
RDW: 12.7 % (ref 11.5–15.5)
WBC: 8.1 10*3/uL (ref 4.0–10.5)

## 2014-06-02 LAB — URINALYSIS, ROUTINE W REFLEX MICROSCOPIC
Bilirubin Urine: NEGATIVE
GLUCOSE, UA: NEGATIVE mg/dL
Ketones, ur: NEGATIVE mg/dL
LEUKOCYTES UA: NEGATIVE
Nitrite: NEGATIVE
Protein, ur: NEGATIVE mg/dL
Specific Gravity, Urine: 1.009 (ref 1.005–1.030)
UROBILINOGEN UA: 0.2 mg/dL (ref 0.0–1.0)
pH: 5.5 (ref 5.0–8.0)

## 2014-06-02 LAB — PROTIME-INR
INR: 1.05 (ref 0.00–1.49)
Prothrombin Time: 13.8 seconds (ref 11.6–15.2)

## 2014-06-02 LAB — COMPREHENSIVE METABOLIC PANEL
ALBUMIN: 4.2 g/dL (ref 3.5–5.2)
ALK PHOS: 53 U/L (ref 39–117)
ALT: 31 U/L (ref 0–35)
AST: 29 U/L (ref 0–37)
Anion gap: 9 (ref 5–15)
BILIRUBIN TOTAL: 0.5 mg/dL (ref 0.3–1.2)
BUN: 13 mg/dL (ref 6–23)
CHLORIDE: 106 meq/L (ref 96–112)
CO2: 24 mmol/L (ref 19–32)
CREATININE: 0.7 mg/dL (ref 0.50–1.10)
Calcium: 9.6 mg/dL (ref 8.4–10.5)
GFR calc non Af Amer: 86 mL/min — ABNORMAL LOW (ref >90)
Glucose, Bld: 132 mg/dL — ABNORMAL HIGH (ref 70–99)
Potassium: 4.2 mmol/L (ref 3.5–5.1)
Sodium: 139 mmol/L (ref 135–145)
Total Protein: 7 g/dL (ref 6.0–8.3)

## 2014-06-02 LAB — URINE MICROSCOPIC-ADD ON: RBC / HPF: NONE SEEN RBC/hpf (ref <3)

## 2014-06-02 LAB — SURGICAL PCR SCREEN
MRSA, PCR: NEGATIVE
Staphylococcus aureus: POSITIVE — AB

## 2014-06-02 LAB — APTT: aPTT: 34 seconds (ref 24–37)

## 2014-06-02 NOTE — Progress Notes (Signed)
Before her surgery in 2008, she reports that she was seen for cardiac clearance & all was ok. She had chest pain in 2014 & was seen for a stress test & the findings were wnl. Pt. Reports that the chest discomfort that she was having at the time was related to reflux. Pt. Followed by Hart CarwinW. Elkins for PCP.

## 2014-06-03 NOTE — Progress Notes (Signed)
Anesthesia Chart Review:  Patient is a 68 year old female scheduled for L4-5 lateral interbody fusion, L4-5 posterior fusion on 06/09/14 by Dr. Yevette Edwardsumonski.  History includes non-smoker, GERD, hiatal hernia, chronic back pain.  BMI is consistent with obesity.  PCP is Dr. Jeannetta NapElkins. She is not routinely followed by cardiology, but was seen by Dr. Eden EmmsNishan in 2008 for an abnormal EKG and need for surgical clearance. She also had a stress test in 2014 for chest pain which was thought to be related to reflux--stress test was non-ischemic.  EKG on 06/02/14 showed: NSR, inferior infarct (age undetermined), posterior infarct (age undetermined), anterior T wave inversion, consider ischemia.  Anterior T wave abnormality is slightly more prominent when compared to 02/13/13 EKG, but overall I think it appears similar to her 09/19/06 EKG.  Inferior Q waves (probably insignificant in lead II) were also present on previous EKGs.  ([facility] and placed on her chart for comparison as needed.)  Nuclear stress test on 02/14/13 showed: 1. No evidence of inducible/reversible ischemia. 2. Normal ventricular wall motion. 3. Calculated ejection fraction of 83% is likely exaggerated due to the relatively small cardiac size.  Echo on 10/14/06: - There is mild diastolic dysfunction. Overall left ventricular systolic function was normal. Left ventricular ejectionfraction was estimated to be 60 %. There was no diagnostic evidence of left ventricular regional wall motion abnormalities. - Aortic valve thickness was mildly increased. - There was mild mitral annular calcification. - The left atrium was mildly dilated. - The right ventricle was mildly dilated.  Preoperative CXR and labs noted.  Overall, I think her EKG is stable for the last several years.  She had a non-ischemic stress test just over a year ago.  If no acute changes then I would anticipate that she could proceed as planned.  Courtney Ochsllison Maquita Sandoval,  PA-C [facility]Anesthesiology Phone [phone] 06/03/2014 9:26 AM

## 2014-06-08 MED ORDER — CEFAZOLIN SODIUM-DEXTROSE 2-3 GM-% IV SOLR
2.0000 g | INTRAVENOUS | Status: AC
  Administered 2014-06-09 (×2): 2 g via INTRAVENOUS
  Filled 2014-06-08: qty 50

## 2014-06-08 MED ORDER — POVIDONE-IODINE 7.5 % EX SOLN
Freq: Once | CUTANEOUS | Status: DC
  Filled 2014-06-08: qty 118

## 2014-06-09 ENCOUNTER — Inpatient Hospital Stay (HOSPITAL_COMMUNITY): Payer: Medicare Other | Admitting: Vascular Surgery

## 2014-06-09 ENCOUNTER — Inpatient Hospital Stay (HOSPITAL_COMMUNITY)
Admission: RE | Admit: 2014-06-09 | Discharge: 2014-06-11 | DRG: 455 | Disposition: A | Discharge status: Home Health | Payer: Medicare Other | Source: Ambulatory Visit | Attending: Orthopedic Surgery | Admitting: Orthopedic Surgery

## 2014-06-09 ENCOUNTER — Inpatient Hospital Stay (HOSPITAL_COMMUNITY): Payer: Medicare Other

## 2014-06-09 ENCOUNTER — Encounter (HOSPITAL_COMMUNITY): Payer: Self-pay | Admitting: Certified Registered Nurse Anesthetist

## 2014-06-09 ENCOUNTER — Encounter (HOSPITAL_COMMUNITY)
Admission: RE | Disposition: A | Discharge status: Home Health | Payer: Self-pay | Source: Ambulatory Visit | Attending: Orthopedic Surgery

## 2014-06-09 ENCOUNTER — Inpatient Hospital Stay (HOSPITAL_COMMUNITY): Payer: Medicare Other | Admitting: Certified Registered Nurse Anesthetist

## 2014-06-09 DIAGNOSIS — M545 Low back pain: Secondary | ICD-10-CM | POA: Diagnosis present

## 2014-06-09 DIAGNOSIS — K219 Gastro-esophageal reflux disease without esophagitis: Secondary | ICD-10-CM | POA: Diagnosis present

## 2014-06-09 DIAGNOSIS — M47819 Spondylosis without myelopathy or radiculopathy, site unspecified: Secondary | ICD-10-CM | POA: Diagnosis present

## 2014-06-09 DIAGNOSIS — Z419 Encounter for procedure for purposes other than remedying health state, unspecified: Secondary | ICD-10-CM

## 2014-06-09 DIAGNOSIS — M4656 Other infective spondylopathies, lumbar region: Secondary | ICD-10-CM | POA: Diagnosis not present

## 2014-06-09 DIAGNOSIS — M479 Spondylosis, unspecified: Principal | ICD-10-CM | POA: Diagnosis present

## 2014-06-09 DIAGNOSIS — Z9889 Other specified postprocedural states: Secondary | ICD-10-CM | POA: Diagnosis not present

## 2014-06-09 HISTORY — PX: ANTERIOR LAT LUMBAR FUSION: SHX1168

## 2014-06-09 HISTORY — PX: ANTERIOR FUSION LUMBAR SPINE: SUR629

## 2014-06-09 HISTORY — PX: POSTERIOR LUMBAR FUSION: SHX6036

## 2014-06-09 SURGERY — ANTERIOR LATERAL LUMBAR FUSION 1 LEVEL
Anesthesia: General | Site: Back

## 2014-06-09 MED ORDER — BUPIVACAINE-EPINEPHRINE 0.25% -1:200000 IJ SOLN
INTRAMUSCULAR | Status: DC | PRN
  Administered 2014-06-09: 5 mL

## 2014-06-09 MED ORDER — ACETAMINOPHEN 325 MG PO TABS: 650.0000 mg | ORAL_TABLET | ORAL | Status: DC | PRN

## 2014-06-09 MED ORDER — PROPOFOL 10 MG/ML IV BOLUS
INTRAVENOUS | Status: DC | PRN
  Administered 2014-06-09: 160 mg via INTRAVENOUS

## 2014-06-09 MED ORDER — SENNOSIDES-DOCUSATE SODIUM 8.6-50 MG PO TABS: 1.0000 | ORAL_TABLET | Freq: Every evening | ORAL | Status: DC | PRN

## 2014-06-09 MED ORDER — ONDANSETRON HCL 4 MG/2ML IJ SOLN
4.0000 mg | Freq: Four times a day (QID) | INTRAMUSCULAR | Status: DC | PRN
  Filled 2014-06-09: qty 2

## 2014-06-09 MED ORDER — SODIUM CHLORIDE 0.9 % IV SOLN: 250.0000 mL | INTRAVENOUS | Status: DC

## 2014-06-09 MED ORDER — ONDANSETRON HCL 4 MG/2ML IJ SOLN: 4.0000 mg | Freq: Once | INTRAMUSCULAR | Status: DC | PRN

## 2014-06-09 MED ORDER — MIDAZOLAM HCL 5 MG/5ML IJ SOLN
INTRAMUSCULAR | Status: DC | PRN
  Administered 2014-06-09: 2 mg via INTRAVENOUS

## 2014-06-09 MED ORDER — PROPOFOL INFUSION 10 MG/ML OPTIME
INTRAVENOUS | Status: DC | PRN
  Administered 2014-06-09: 50 ug/kg/min via INTRAVENOUS
  Administered 2014-06-09: 40 ug/kg/min via INTRAVENOUS

## 2014-06-09 MED ORDER — LIDOCAINE HCL (CARDIAC) 20 MG/ML IV SOLN
INTRAVENOUS | Status: DC | PRN
  Administered 2014-06-09: 80 mg via INTRAVENOUS

## 2014-06-09 MED ORDER — SODIUM CHLORIDE 0.9 % IV SOLN
INTRAVENOUS | Status: DC
  Administered 2014-06-09 – 2014-06-10 (×3): via INTRAVENOUS

## 2014-06-09 MED ORDER — BUPIVACAINE-EPINEPHRINE (PF) 0.25% -1:200000 IJ SOLN
INTRAMUSCULAR | Status: AC
  Filled 2014-06-09: qty 30

## 2014-06-09 MED ORDER — PHENYLEPHRINE HCL 10 MG/ML IJ SOLN
INTRAMUSCULAR | Status: DC | PRN
  Administered 2014-06-09: 100 ug via INTRAVENOUS
  Administered 2014-06-09: 40 ug via INTRAVENOUS
  Administered 2014-06-09: 80 ug via INTRAVENOUS
  Administered 2014-06-09 (×2): 40 ug via INTRAVENOUS
  Administered 2014-06-09: 80 ug via INTRAVENOUS
  Administered 2014-06-09: 40 ug via INTRAVENOUS
  Administered 2014-06-09 (×2): 80 ug via INTRAVENOUS
  Administered 2014-06-09: 40 ug via INTRAVENOUS

## 2014-06-09 MED ORDER — SODIUM CHLORIDE 0.9 % IJ SOLN: 3.0000 mL | Freq: Two times a day (BID) | INTRAMUSCULAR | Status: DC

## 2014-06-09 MED ORDER — HYDROMORPHONE HCL 1 MG/ML IJ SOLN
0.2500 mg | INTRAMUSCULAR | Status: DC | PRN
  Administered 2014-06-09 (×4): 0.5 mg via INTRAVENOUS

## 2014-06-09 MED ORDER — DIAZEPAM 5 MG PO TABS
5.0000 mg | ORAL_TABLET | Freq: Four times a day (QID) | ORAL | Status: DC | PRN
  Administered 2014-06-10: 5 mg via ORAL
  Filled 2014-06-09: qty 1

## 2014-06-09 MED ORDER — MENTHOL 3 MG MT LOZG: 1.0000 | LOZENGE | OROMUCOSAL | Status: DC | PRN

## 2014-06-09 MED ORDER — THROMBIN 20000 UNITS EX SOLR
CUTANEOUS | Status: AC
  Filled 2014-06-09: qty 20000

## 2014-06-09 MED ORDER — STERILE WATER FOR INJECTION IJ SOLN
INTRAMUSCULAR | Status: AC
  Filled 2014-06-09: qty 20

## 2014-06-09 MED ORDER — INFLUENZA VAC SPLIT QUAD 0.5 ML IM SUSY
0.5000 mL | PREFILLED_SYRINGE | INTRAMUSCULAR | Status: AC
  Administered 2014-06-10: 0.5 mL via INTRAMUSCULAR
  Filled 2014-06-09: qty 0.5

## 2014-06-09 MED ORDER — ARTIFICIAL TEARS OP OINT
TOPICAL_OINTMENT | OPHTHALMIC | Status: AC
  Filled 2014-06-09: qty 7

## 2014-06-09 MED ORDER — DIPHENHYDRAMINE HCL 12.5 MG/5ML PO ELIX
12.5000 mg | ORAL_SOLUTION | Freq: Four times a day (QID) | ORAL | Status: DC | PRN
  Filled 2014-06-09: qty 5

## 2014-06-09 MED ORDER — ROCURONIUM BROMIDE 50 MG/5ML IV SOLN
INTRAVENOUS | Status: AC
  Filled 2014-06-09: qty 1

## 2014-06-09 MED ORDER — NALOXONE HCL 0.4 MG/ML IJ SOLN
0.4000 mg | INTRAMUSCULAR | Status: DC | PRN
  Filled 2014-06-09: qty 1

## 2014-06-09 MED ORDER — SODIUM CHLORIDE 0.9 % IJ SOLN: 3.0000 mL | INTRAMUSCULAR | Status: DC | PRN

## 2014-06-09 MED ORDER — FENTANYL CITRATE 0.05 MG/ML IJ SOLN
INTRAMUSCULAR | Status: AC
  Filled 2014-06-09: qty 5

## 2014-06-09 MED ORDER — PROPOFOL 10 MG/ML IV BOLUS
INTRAVENOUS | Status: AC
  Filled 2014-06-09: qty 20

## 2014-06-09 MED ORDER — HYDROMORPHONE HCL 1 MG/ML IJ SOLN
0.5000 mg | INTRAMUSCULAR | Status: DC | PRN
  Administered 2014-06-10 – 2014-06-11 (×6): 1 mg via INTRAVENOUS
  Filled 2014-06-09 (×6): qty 1

## 2014-06-09 MED ORDER — LACTATED RINGERS IV SOLN
INTRAVENOUS | Status: DC | PRN
  Administered 2014-06-09 (×4): via INTRAVENOUS

## 2014-06-09 MED ORDER — BISACODYL 5 MG PO TBEC: 5.0000 mg | DELAYED_RELEASE_TABLET | Freq: Every day | ORAL | Status: DC | PRN

## 2014-06-09 MED ORDER — FENTANYL CITRATE 0.05 MG/ML IJ SOLN
INTRAMUSCULAR | Status: DC | PRN
  Administered 2014-06-09: 150 ug via INTRAVENOUS
  Administered 2014-06-09 (×2): 50 ug via INTRAVENOUS
  Administered 2014-06-09: 100 ug via INTRAVENOUS

## 2014-06-09 MED ORDER — ONDANSETRON HCL 4 MG/2ML IJ SOLN
INTRAMUSCULAR | Status: DC | PRN
  Administered 2014-06-09: 4 mg via INTRAVENOUS

## 2014-06-09 MED ORDER — EPHEDRINE SULFATE 50 MG/ML IJ SOLN
INTRAMUSCULAR | Status: DC | PRN
  Administered 2014-06-09: 10 mg via INTRAVENOUS
  Administered 2014-06-09: 15 mg via INTRAVENOUS
  Administered 2014-06-09: 10 mg via INTRAVENOUS
  Administered 2014-06-09: 15 mg via INTRAVENOUS

## 2014-06-09 MED ORDER — DOCUSATE SODIUM 100 MG PO CAPS
100.0000 mg | ORAL_CAPSULE | Freq: Two times a day (BID) | ORAL | Status: DC
  Administered 2014-06-10 – 2014-06-11 (×3): 100 mg via ORAL
  Filled 2014-06-09 (×4): qty 1

## 2014-06-09 MED ORDER — DEXAMETHASONE SODIUM PHOSPHATE 4 MG/ML IJ SOLN
INTRAMUSCULAR | Status: DC | PRN
  Administered 2014-06-09: 4 mg via INTRAVENOUS

## 2014-06-09 MED ORDER — FLEET ENEMA 7-19 GM/118ML RE ENEM
1.0000 | ENEMA | Freq: Once | RECTAL | Status: AC | PRN
  Filled 2014-06-09: qty 1

## 2014-06-09 MED ORDER — ACETAMINOPHEN 10 MG/ML IV SOLN
INTRAVENOUS | Status: AC
  Filled 2014-06-09: qty 100

## 2014-06-09 MED ORDER — ONDANSETRON HCL 4 MG/2ML IJ SOLN
4.0000 mg | INTRAMUSCULAR | Status: DC | PRN
  Administered 2014-06-10 (×2): 4 mg via INTRAVENOUS
  Filled 2014-06-09 (×2): qty 2

## 2014-06-09 MED ORDER — EPHEDRINE SULFATE 50 MG/ML IJ SOLN
INTRAMUSCULAR | Status: AC
  Filled 2014-06-09: qty 2

## 2014-06-09 MED ORDER — SUCCINYLCHOLINE CHLORIDE 20 MG/ML IJ SOLN
INTRAMUSCULAR | Status: AC
  Filled 2014-06-09: qty 2

## 2014-06-09 MED ORDER — PANTOPRAZOLE SODIUM 40 MG PO TBEC
40.0000 mg | DELAYED_RELEASE_TABLET | Freq: Every day | ORAL | Status: DC
  Administered 2014-06-10 – 2014-06-11 (×2): 40 mg via ORAL
  Filled 2014-06-09 (×2): qty 1

## 2014-06-09 MED ORDER — ONDANSETRON HCL 4 MG/2ML IJ SOLN
INTRAMUSCULAR | Status: AC
  Filled 2014-06-09: qty 2

## 2014-06-09 MED ORDER — 0.9 % SODIUM CHLORIDE (POUR BTL) OPTIME
TOPICAL | Status: DC | PRN
  Administered 2014-06-09: 1000 mL

## 2014-06-09 MED ORDER — DIPHENHYDRAMINE HCL 50 MG/ML IJ SOLN
12.5000 mg | Freq: Four times a day (QID) | INTRAMUSCULAR | Status: DC | PRN
  Filled 2014-06-09: qty 0.25

## 2014-06-09 MED ORDER — MIDAZOLAM HCL 2 MG/2ML IJ SOLN
INTRAMUSCULAR | Status: AC
  Filled 2014-06-09: qty 2

## 2014-06-09 MED ORDER — ZOLPIDEM TARTRATE 5 MG PO TABS: 5.0000 mg | ORAL_TABLET | Freq: Every evening | ORAL | Status: DC | PRN

## 2014-06-09 MED ORDER — HYDROMORPHONE 0.3 MG/ML IV SOLN
INTRAVENOUS | Status: AC
  Filled 2014-06-09: qty 25

## 2014-06-09 MED ORDER — SUCCINYLCHOLINE CHLORIDE 20 MG/ML IJ SOLN
INTRAMUSCULAR | Status: DC | PRN
  Administered 2014-06-09: 100 mg via INTRAVENOUS

## 2014-06-09 MED ORDER — HYDROMORPHONE 0.3 MG/ML IV SOLN
INTRAVENOUS | Status: DC
  Administered 2014-06-09: 1.67 mg via INTRAVENOUS
  Administered 2014-06-09: 15:00:00 via INTRAVENOUS
  Administered 2014-06-10: 1.79 mg via INTRAVENOUS
  Administered 2014-06-10: 01:00:00 via INTRAVENOUS
  Administered 2014-06-10: 2.17 mg via INTRAVENOUS
  Administered 2014-06-10: 2.79 mg via INTRAVENOUS
  Filled 2014-06-09 (×2): qty 25

## 2014-06-09 MED ORDER — CEFAZOLIN SODIUM 1-5 GM-% IV SOLN
1.0000 g | Freq: Three times a day (TID) | INTRAVENOUS | Status: AC
  Administered 2014-06-09 – 2014-06-10 (×2): 1 g via INTRAVENOUS
  Filled 2014-06-09 (×2): qty 50

## 2014-06-09 MED ORDER — ALUM & MAG HYDROXIDE-SIMETH 200-200-20 MG/5ML PO SUSP: 30.0000 mL | Freq: Four times a day (QID) | ORAL | Status: DC | PRN

## 2014-06-09 MED ORDER — ACETAMINOPHEN 650 MG RE SUPP: 650.0000 mg | RECTAL | Status: DC | PRN

## 2014-06-09 MED ORDER — LIDOCAINE HCL (CARDIAC) 20 MG/ML IV SOLN
INTRAVENOUS | Status: AC
  Filled 2014-06-09: qty 5

## 2014-06-09 MED ORDER — THROMBIN 20000 UNITS EX SOLR
OROMUCOSAL | Status: DC | PRN
  Administered 2014-06-09: 10:00:00 via TOPICAL

## 2014-06-09 MED ORDER — PHENOL 1.4 % MT LIQD: 1.0000 | OROMUCOSAL | Status: DC | PRN

## 2014-06-09 MED ORDER — HYDROMORPHONE HCL 2 MG PO TABS
1.0000 mg | ORAL_TABLET | ORAL | Status: DC | PRN
  Administered 2014-06-10 – 2014-06-11 (×3): 2 mg via ORAL
  Filled 2014-06-09 (×3): qty 1

## 2014-06-09 MED ORDER — PROPOFOL 10 MG/ML IV EMUL
INTRAVENOUS | Status: AC
  Filled 2014-06-09: qty 300

## 2014-06-09 MED ORDER — PHENYLEPHRINE 40 MCG/ML (10ML) SYRINGE FOR IV PUSH (FOR BLOOD PRESSURE SUPPORT)
PREFILLED_SYRINGE | INTRAVENOUS | Status: AC
  Filled 2014-06-09: qty 20

## 2014-06-09 MED ORDER — SODIUM CHLORIDE 0.9 % IJ SOLN: 9.0000 mL | INTRAMUSCULAR | Status: DC | PRN

## 2014-06-09 MED ORDER — HYDROMORPHONE HCL 1 MG/ML IJ SOLN
INTRAMUSCULAR | Status: AC
  Administered 2014-06-09: 0.5 mg via INTRAVENOUS
  Filled 2014-06-09: qty 1

## 2014-06-09 MED ORDER — ARTIFICIAL TEARS OP OINT
TOPICAL_OINTMENT | OPHTHALMIC | Status: DC | PRN
  Administered 2014-06-09: 1 via OPHTHALMIC

## 2014-06-09 SURGICAL SUPPLY — 109 items
APL SKNCLS STERI-STRIP NONHPOA (GAUZE/BANDAGES/DRESSINGS) ×1
BENZOIN TINCTURE PRP APPL 2/3 (GAUZE/BANDAGES/DRESSINGS) ×3 IMPLANT
BLADE SURG 10 STRL SS (BLADE) ×3 IMPLANT
BLADE SURG ROTATE 9660 (MISCELLANEOUS) IMPLANT
BOLT SPNL LRG 45X5.5XPLAT NS (Screw) IMPLANT
BUR ROUND PRECISION 4.0 (BURR) ×2 IMPLANT
BUR ROUND PRECISION 4.0MM (BURR) ×1
CAGE COROENT XL 14X18X50 (Cage) ×2 IMPLANT
CARTRIDGE OIL MAESTRO DRILL (MISCELLANEOUS) ×1 IMPLANT
CLOSURE STERI-STRIP 1/2X4 (GAUZE/BANDAGES/DRESSINGS) ×2
CLOSURE WOUND 1/2 X4 (GAUZE/BANDAGES/DRESSINGS) ×2
CLSR STERI-STRIP ANTIMIC 1/2X4 (GAUZE/BANDAGES/DRESSINGS) ×2 IMPLANT
CONT SPEC STER OR (MISCELLANEOUS) ×3 IMPLANT
CORDS BIPOLAR (ELECTRODE) ×3 IMPLANT
COVER MAYO STAND STRL (DRAPES) ×6 IMPLANT
COVER SURGICAL LIGHT HANDLE (MISCELLANEOUS) ×3 IMPLANT
DIFFUSER DRILL AIR PNEUMATIC (MISCELLANEOUS) ×3 IMPLANT
DRAIN CHANNEL 15F RND FF W/TCR (WOUND CARE) IMPLANT
DRAPE C-ARM 42X72 X-RAY (DRAPES) ×3 IMPLANT
DRAPE ORTHO SPLIT 77X108 STRL (DRAPES) ×3
DRAPE POUCH INSTRU U-SHP 10X18 (DRAPES) ×3 IMPLANT
DRAPE SURG 17X23 STRL (DRAPES) ×9 IMPLANT
DRAPE SURG ORHT 6 SPLT 77X108 (DRAPES) ×1 IMPLANT
DRAPE U-SHAPE 47X51 STRL (DRAPES) ×3 IMPLANT
DRSG MEPILEX BORDER 4X8 (GAUZE/BANDAGES/DRESSINGS) ×3 IMPLANT
DURAPREP 26ML APPLICATOR (WOUND CARE) ×3 IMPLANT
ELECT BLADE 4.0 EZ CLEAN MEGAD (MISCELLANEOUS) ×3
ELECT BLADE 6.5 EXT (BLADE) ×3 IMPLANT
ELECT CAUTERY BLADE 6.4 (BLADE) ×3 IMPLANT
ELECT REM PT RETURN 9FT ADLT (ELECTROSURGICAL) ×3
ELECTRODE BLDE 4.0 EZ CLN MEGD (MISCELLANEOUS) ×1 IMPLANT
ELECTRODE REM PT RTRN 9FT ADLT (ELECTROSURGICAL) ×1 IMPLANT
EVACUATOR SILICONE 100CC (DRAIN) IMPLANT
GAUZE SPONGE 4X4 12PLY STRL (GAUZE/BANDAGES/DRESSINGS) ×5 IMPLANT
GAUZE SPONGE 4X4 16PLY XRAY LF (GAUZE/BANDAGES/DRESSINGS) ×12 IMPLANT
GLOVE BIO SURGEON STRL SZ7 (GLOVE) ×6 IMPLANT
GLOVE BIO SURGEON STRL SZ8 (GLOVE) ×3 IMPLANT
GLOVE BIOGEL PI IND STRL 7.0 (GLOVE) ×1 IMPLANT
GLOVE BIOGEL PI IND STRL 8 (GLOVE) ×1 IMPLANT
GLOVE BIOGEL PI INDICATOR 7.0 (GLOVE) ×2
GLOVE BIOGEL PI INDICATOR 8 (GLOVE) ×2
GOWN STRL REUS W/ TWL LRG LVL3 (GOWN DISPOSABLE) ×2 IMPLANT
GOWN STRL REUS W/ TWL XL LVL3 (GOWN DISPOSABLE) ×2 IMPLANT
GOWN STRL REUS W/TWL LRG LVL3 (GOWN DISPOSABLE) ×6
GOWN STRL REUS W/TWL XL LVL3 (GOWN DISPOSABLE) ×6
GUIDEWIRE SHARP VIPER II (WIRE) ×2 IMPLANT
IV CATH 14GX2 1/4 (CATHETERS) ×3 IMPLANT
KIT BASIN OR (CUSTOM PROCEDURE TRAY) ×3 IMPLANT
KIT DILATOR XLIF 5 (KITS) IMPLANT
KIT NDL NVM5 EMG ELECT (KITS) IMPLANT
KIT NEEDLE NVM5 EMG ELECT (KITS) ×1 IMPLANT
KIT NEEDLE NVM5 EMG ELECTRODE (KITS) ×2
KIT POSITION SURG JACKSON T1 (MISCELLANEOUS) ×3 IMPLANT
KIT ROOM TURNOVER OR (KITS) ×3 IMPLANT
KIT SURGICAL ACCESS MAXCESS 4 (KITS) ×2 IMPLANT
KIT XLIF (KITS) ×2
MARKER SKIN DUAL TIP RULER LAB (MISCELLANEOUS) ×3 IMPLANT
MIX DBX 10CC 35% BONE (Bone Implant) ×2 IMPLANT
NDL HYPO 25GX1X1/2 BEV (NEEDLE) ×1 IMPLANT
NDL JAMSHIDI VIPER (NEEDLE) IMPLANT
NDL SAFETY ECLIPSE 18X1.5 (NEEDLE) ×1 IMPLANT
NDL SPNL 18GX3.5 QUINCKE PK (NEEDLE) ×2 IMPLANT
NEEDLE 22X1 1/2 (OR ONLY) (NEEDLE) ×3 IMPLANT
NEEDLE BONE MARROW 8GX6 FENEST (NEEDLE) IMPLANT
NEEDLE HYPO 18GX1.5 SHARP (NEEDLE) ×3
NEEDLE HYPO 25GX1X1/2 BEV (NEEDLE) ×3 IMPLANT
NEEDLE JAMSHIDI VIPER (NEEDLE) ×6 IMPLANT
NEEDLE SPNL 18GX3.5 QUINCKE PK (NEEDLE) ×6 IMPLANT
NS IRRIG 1000ML POUR BTL (IV SOLUTION) ×3 IMPLANT
OIL CARTRIDGE MAESTRO DRILL (MISCELLANEOUS) ×3
PACK LAMINECTOMY ORTHO (CUSTOM PROCEDURE TRAY) ×3 IMPLANT
PACK UNIVERSAL I (CUSTOM PROCEDURE TRAY) ×3 IMPLANT
PAD ARMBOARD 7.5X6 YLW CONV (MISCELLANEOUS) ×6 IMPLANT
PATTIES SURGICAL .5 X1 (DISPOSABLE) ×3 IMPLANT
PATTIES SURGICAL .5X1.5 (GAUZE/BANDAGES/DRESSINGS) ×3 IMPLANT
PLATE BOLT XLIF DECADE 5.5X35 (Plate) ×2 IMPLANT
PLATE DECADE XLIF 2H SZ14 (Plate) ×2 IMPLANT
ROD VIPER II LORDOSED 5.5X40 (Rod) ×4 IMPLANT
SCREW 45MM (Screw) ×3 IMPLANT
SCREW SET SINGLE INNER MIS (Screw) ×8 IMPLANT
SCREW VIPER X-TAB 6.0X40 (Screw) ×2 IMPLANT
SPONGE INTESTINAL PEANUT (DISPOSABLE) ×6 IMPLANT
SPONGE LAP 4X18 X RAY DECT (DISPOSABLE) ×3 IMPLANT
SPONGE SURGIFOAM ABS GEL 100 (HEMOSTASIS) ×3 IMPLANT
STRIP CLOSURE SKIN 1/2X4 (GAUZE/BANDAGES/DRESSINGS) ×4 IMPLANT
SURGIFLO TRUKIT (HEMOSTASIS) IMPLANT
SUT MNCRL AB 4-0 PS2 18 (SUTURE) ×6 IMPLANT
SUT PDS AB 1 CTX 36 (SUTURE) ×3 IMPLANT
SUT PROLENE 5 0 C 1 24 (SUTURE) IMPLANT
SUT SILK 2 0 TIES 10X30 (SUTURE) IMPLANT
SUT VIC AB 0 CT1 18XCR BRD 8 (SUTURE) ×1 IMPLANT
SUT VIC AB 0 CT1 8-18 (SUTURE) ×3
SUT VIC AB 1 CT1 18XCR BRD 8 (SUTURE) ×2 IMPLANT
SUT VIC AB 1 CT1 8-18 (SUTURE) ×6
SUT VIC AB 2-0 CT2 18 VCP726D (SUTURE) ×3 IMPLANT
SYR 20CC LL (SYRINGE) ×3 IMPLANT
SYR BULB IRRIGATION 50ML (SYRINGE) ×3 IMPLANT
SYR CONTROL 10ML LL (SYRINGE) ×6 IMPLANT
SYR TB 1ML LUER SLIP (SYRINGE) ×3 IMPLANT
TABS EXTENSION VIPER 6X35 (Neuro Prosthesis/Implant) IMPLANT
TAP CANN VIPER2 DL 5.0 (TAP) ×2 IMPLANT
TAPE CLOTH SURG 4X10 WHT LF (GAUZE/BANDAGES/DRESSINGS) ×4 IMPLANT
TOWEL OR 17X24 6PK STRL BLUE (TOWEL DISPOSABLE) ×3 IMPLANT
TOWEL OR 17X26 10 PK STRL BLUE (TOWEL DISPOSABLE) ×3 IMPLANT
TRAY FOLEY CATH 16FR SILVER (SET/KITS/TRAYS/PACK) ×3 IMPLANT
TRAY FOLEY CATH 16FRSI W/METER (SET/KITS/TRAYS/PACK) ×3 IMPLANT
WATER STERILE IRR 1000ML POUR (IV SOLUTION) ×3 IMPLANT
X-TABS VIPER 6X35 (Neuro Prosthesis/Implant) ×9 IMPLANT
YANKAUER SUCT BULB TIP NO VENT (SUCTIONS) ×3 IMPLANT

## 2014-06-09 NOTE — H&P (Signed)
     PREOPERATIVE H&P  Chief Complaint: Low back pain  HPI: Courtney Hill is a 69 y.o. female who presents with ongoing pain in the low back. 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 Patient has failed multiple forms of conservative care, including RFA procedures.  L4/5 arthrosis noted on radiographs and MRI  Patient has failed multiple forms of conservative care and continues to have pain (see office notes for additional details regarding the patient's full course of treatment)  Past Medical History  Diagnosis Date  . Chronic back pain   . Carpal tunnel syndrome   . GERD (gastroesophageal reflux disease)   . History of hiatal hernia   . Arthritis     spine   . History of blood transfusion 1972    post miscarriage    Past Surgical History  Procedure Laterality Date  . Carpal tunnel release Bilateral   . Abdominal hysterectomy    . Tarsal tunnel release      metal plate put in  . Back surgery  2008    fusion  . Tonsillectomy    . Appendectomy    . Fracture surgery      fx _ L wrist, with hardware   History   Social History  . Marital Status: Married    Spouse Name: N/A    Number of Children: N/A  . Years of Education: N/A   Social History Main Topics  . Smoking status: Never Smoker   . Smokeless tobacco: None  . Alcohol Use: No  . Drug Use: No  . Sexual Activity: None   Other Topics Concern  . None   Social History Narrative   History reviewed. No pertinent family history. No Known Allergies Prior to Admission medications   Medication Sig Start Date End Date Taking? Authorizing Provider  HYDROcodone-acetaminophen (NORCO/VICODIN) 5-325 MG per tablet Take 1 tablet by mouth every 6 (six) hours as needed for moderate pain. For pain   Yes Historical Provider, MD  omeprazole (PRILOSEC) 20 MG capsule Take 20 mg by mouth daily before breakfast.    Yes Historical Provider, MD  aspirin 325 MG tablet Take 1 tablet (325 mg  total) by mouth daily. 02/14/13   Rhetta MuraJai-Gurmukh Samtani, MD  pantoprazole (PROTONIX) 40 MG tablet Take 1 tablet (40 mg total) by mouth daily. Patient not taking: Reported on 05/26/2014 02/14/13   Rhetta MuraJai-Gurmukh Samtani, MD     All other systems have been reviewed and were otherwise negative with the exception of those mentioned in the HPI and as above.  Physical Exam: Filed Vitals:   06/09/14 0633  BP: 137/57  Pulse: 64  Temp: 98.9 F (37.2 C)  Resp: 18    General: Alert, no acute distress Cardiovascular: No pedal edema Respiratory: No cyanosis, no use of accessory musculature Skin: No lesions in the area of chief complaint Neurologic: Sensation intact distally Psychiatric: Patient is competent for consent with normal mood and affect Lymphatic: No axillary or cervical lymphadenopathy  MUSCULOSKELETAL: + TTP low back  Assessment/Plan: Arthritis of the spine Plan for Procedure(s): ANTERIOR LATERAL LUMBAR FUSION 1 LEVEL POSTERIOR LUMBAR FUSION 1 LEVEL   Emilee HeroUMONSKI,Willean Schurman LEONARD, MD 06/09/2014 7:21 AM

## 2014-06-09 NOTE — Anesthesia Preprocedure Evaluation (Addendum)
Anesthesia Evaluation  Patient identified by MRN, date of birth, ID band Patient awake    Reviewed: Allergy & Precautions, NPO status , Patient's Chart, lab work & pertinent test results  Airway Mallampati: III  TM Distance: >3 FB Neck ROM: Full  Mouth opening: Limited Mouth Opening  Dental  (+) Teeth Intact, Dental Advisory Given   Pulmonary          Cardiovascular     Neuro/Psych  Neuromuscular disease    GI/Hepatic hiatal hernia, GERD-  Medicated and Controlled,  Endo/Other  Morbid obesity  Renal/GU      Musculoskeletal  (+) Arthritis -,   Abdominal   Peds  Hematology   Anesthesia Other Findings   Reproductive/Obstetrics                            Anesthesia Physical Anesthesia Plan  ASA: II  Anesthesia Plan: General   Post-op Pain Management:    Induction: Intravenous  Airway Management Planned: Oral ETT  Additional Equipment:   Intra-op Plan:   Post-operative Plan: Extubation in OR  Informed Consent: I have reviewed the patients History and Physical, chart, labs and discussed the procedure including the risks, benefits and alternatives for the proposed anesthesia with the patient or authorized representative who has indicated his/her understanding and acceptance.     Plan Discussed with: CRNA, Anesthesiologist and Surgeon  Anesthesia Plan Comments:         Anesthesia Quick Evaluation

## 2014-06-09 NOTE — Anesthesia Postprocedure Evaluation (Signed)
  Anesthesia Post-op Note  Patient: Courtney Hill  Procedure(s) Performed: Procedure(s) with comments: ANTERIOR LATERAL LUMBAR FUSION 1 LEVEL (N/A) - Lumbar 4-5 lateral interbody fusion POSTERIOR LUMBAR FUSION 1 LEVEL (N/A) - Lumbar 4-5 posterior spinal fusion with instrumentation and allograft  Patient Location: PACU  Anesthesia Type:General  Level of Consciousness: awake, oriented, sedated and patient cooperative  Airway and Oxygen Therapy: Patient Spontanous Breathing  Post-op Pain: moderate  Post-op Assessment: Post-op Vital signs reviewed, Patient's Cardiovascular Status Stable, Respiratory Function Stable, Patent Airway, No signs of Nausea or vomiting and Pain level controlled  Post-op Vital Signs: stable  Last Vitals:  Filed Vitals:   06/09/14 1551  BP:   Pulse:   Temp: 36.4 C  Resp:     Complications: No apparent anesthesia complications

## 2014-06-09 NOTE — Transfer of Care (Signed)
Immediate Anesthesia Transfer of Care Note  Patient: Courtney Hill  Procedure(s) Performed: Procedure(s) with comments: ANTERIOR LATERAL LUMBAR FUSION 1 LEVEL (N/A) - Lumbar 4-5 lateral interbody fusion POSTERIOR LUMBAR FUSION 1 LEVEL (N/A) - Lumbar 4-5 posterior spinal fusion with instrumentation and allograft  Patient Location: PACU  Anesthesia Type:General  Level of Consciousness: awake and alert   Airway & Oxygen Therapy: Patient Spontanous Breathing and Patient connected to nasal cannula oxygen  Post-op Assessment: Report given to PACU RN, Post -op Vital signs reviewed and stable and Patient moving all extremities X 4  Post vital signs: Reviewed and stable  Complications: No apparent anesthesia complications

## 2014-06-09 NOTE — Progress Notes (Signed)
Utilization review completed.  

## 2014-06-09 NOTE — Anesthesia Procedure Notes (Signed)
Procedure Name: Intubation Date/Time: 06/09/2014 8:40 AM Performed by: Ollen Bowl Pre-anesthesia Checklist: Patient identified, Emergency Drugs available, Suction available, Patient being monitored and Timeout performed Patient Re-evaluated:Patient Re-evaluated prior to inductionOxygen Delivery Method: Circle system utilized and Simple face mask Preoxygenation: Pre-oxygenation with 100% oxygen Intubation Type: IV induction Ventilation: Mask ventilation without difficulty Laryngoscope Size: Miller and 2 Grade View: Grade II Tube type: Oral Tube size: 7.5 mm Number of attempts: 1 Airway Equipment and Method: Patient positioned with wedge pillow,  Stylet and LTA kit utilized Placement Confirmation: ETT inserted through vocal cords under direct vision,  positive ETCO2 and breath sounds checked- equal and bilateral Secured at: 21 cm Tube secured with: Tape Dental Injury: Teeth and Oropharynx as per pre-operative assessment

## 2014-06-10 ENCOUNTER — Encounter (HOSPITAL_COMMUNITY): Payer: Self-pay | Admitting: Orthopedic Surgery

## 2014-06-10 DIAGNOSIS — M479 Spondylosis, unspecified: Secondary | ICD-10-CM | POA: Diagnosis not present

## 2014-06-10 NOTE — Progress Notes (Signed)
Rehab Admissions Coordinator Note:  Patient was screened by Trish MageLogue, Shadie Sweatman M for appropriateness for an Inpatient Acute Rehab Consult.  At this time, we are recommending HH.  Trish MageLogue, Miko Sirico M 06/10/2014, 3:08 PM  I can be reached at [phone].

## 2014-06-10 NOTE — Op Note (Signed)
Courtney Hill, Courtney Hill               ACCOUNT NO.:  [id number]  MEDICAL RECORD NO.:  [id number]  LOCATION:                                 FACILITY:  PHYSICIAN:  Estill BambergMark Carroll Ranney, MD      DATE OF BIRTH:  1945-10-31  DATE OF PROCEDURE:  06/09/2014                              OPERATIVE REPORT   PREOPERATIVE DIAGNOSES: 1. 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 2. Status post previous L5-S1 fusion.  POSTOPERATIVE DIAGNOSES: 1. 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 2. Status post previous L5-S1 fusion.  PROCEDURE: 1. Anterior lumbar interbody fusion, L4-L5, via a left-sided lateral     interbody approach. 2. Placement of anterior instrumentation, L4-L5. 3. Insertion of interbody device x1 (14 x 18 mm intervertebral     NuVasive spacer). 4. Use of morselized allograft. 5. Posterior spinal fusion, L4-L5. 6. Placement of posterior instrumentation, L4-L5. 7. Intraoperative use of fluoroscopy.  SURGEON:  Estill BambergMark Alannie Amodio, MD.  ASSISTANJason Coop:  Kayla McKenzie, PA-C  ANESTHESIA:  General endotracheal anesthesia.  COMPLICATIONS:  None.  DISPOSITION:  Stable.  ESTIMATED BLOOD LOSS:  Minimal.  INDICATIONS FOR PROCEDURE:  Briefly, Courtney Hill is a very pleasant 69- year-old female, who is status post an L5-S1 fusion procedure by another [employer] advisers[employer] in 2008.  She did well from that surgery, however, did go on to have substantial pain in her low back.  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  She did have extensive treatment for this including a radiofrequency ablation procedure.  She did get excellent relief with the treatment for her facet arthrosis L4-L5 but did continue to have ongoing pain.  We therefore discussed proceeding with the procedure noted above.  The patient did fully understand the risks and limitations of the procedure, and did wish to proceed.  OPERATIVE DETAILS:  On June 09, 2014, the patient brought to surgery and general endotracheal anesthesia was administered.  The  patient was placed in the lateral decubitus position with the left side up.  The patient was secured to the bed.  The hips and knees were appropriately flexed.  All bony prominences were meticulously padded and an axillary roll was placed on the patient's right axilla.  The region of the left abdomen and flank was prepped in the usual fashion.  A time-out was performed and antibiotics were given.  I then made a left-sided transverse incision overlying the L4-L5 intervertebral space.  The external and internal oblique musculature were dissected away.  I then entered through the transversalis fascia and the retroperitoneal space was readily encountered.  The peritoneum was bluntly swept anteriorly. I then advanced an initial dilator, which was docked over the L4-L5 intervertebral space.  I did use triggered EMG to ensure that I was able to safely access the intervertebral space.  I then sequentially dilated over the initial dilator again, and liberally using triggered EMG. There was no neurologic structures in the immediate vicinity of the dilator.  I then placed a self-retaining retractor, which was secured to the upper aspect of the bed.  The retractor was secured into position using the blade.  The retractor was then opened slightly.  I did use monopolar testing device to ensure  that there were no neurologic structures in the vicinity of the exposure.  None were encountered.  I then performed a thorough and complete diskectomy.  The contralateral annulus was released appropriately.  I then again performed a complete intervertebral diskectomy.  I was very pleased with the endplate preparation and the diskectomy that I was able to accomplish.  I then placed a series of intervertebral trials.  I did feel that a 14 mm x 18 mm intervertebral spacer would be the most appropriate fit.  The appropriate size intervertebral spacer was then packed with DBX mix and tamped into position in the usual  fashion.  I did liberally use AP and lateral fluoroscopy to ensure appropriate positioning of the implant.  I then placed an anterior lumbar plate over the lateral aspect of the spine.  Screws were placed into the L4-L5 vertebral bodies.  The screws were then locked to the plate.  The bed was then flattened and the locking plate was locked.  The retractor was then slowly removed, and I did explore the wound for any undue bleeding and there was none encountered.  I was very pleased with the press fit and the implant and with the placement of the plate.  The wound was then irrigated and closed in layers.  The skin was closed using 3-0 Monocryl.  A sterile field was then taken down.  The patient was then placed in the prone position on a Jackson spinal bed.  All bony prominences were meticulously padded and the back was prepped and draped in usual sterile fashion.  I then marked out the L4 and L5 pedicles.  I then made a paramedian incisions on the left and on the right sides.  Jamshidi needles were advanced into the pedicles at L4-L5 and L5.  AP and lateral fluoroscopy were liberally utilized.  Once the Jamshidi needles were advanced to the appropriate position, guidewires were placed into the L4 and L5 pedicles.  I then used a 5 mm tap over the guidewires, again liberally using AP and lateral fluoroscopy.  I did use triggered EMG to test each of the taps and there was no tap that tested below 15 milliamps.  I then exposed the posterolateral gutter on the right and on the left side,  which was then decorticated.  The remainder of the DBX mix was packed into the posterolateral gutters on the right and on the left side to aid in the success of the fusion.  6 mm screws of the appropriate length were then advanced over the guidewires at L4 and L5. A 40 mm rod was secured across the screws on the right and on the left. Caps were then placed and a final locking procedure was performed.  I was  very pleased with the AP and lateral fluoroscopic images.  This compressed across each of the screws on the right and on the left.  The wound was copiously irrigated and again closed in layers.  The skin was then closed using 3-0 Monocryl.  Benzoin and Steri-Strips were applied followed by sterile dressing.  All instrument counts were correct at the termination of the procedure.  Of note, there was no abnormal sustained EMG activity noted throughout the entire surgery.  Of note, Jason Coop was my assistant throughout surgery the and did aid in retraction, suctioning, and closure.     Estill Bamberg, MD     MD/MEDQ  D:  06/09/2014  T:  06/10/2014  Job:  [id number]  cc:  Windle Guard, M.D.

## 2014-06-10 NOTE — Evaluation (Signed)
Physical Therapy Evaluation Patient Details Name: Courtney HoveLorena M Hill MRN: [id number] DOB: 05/25/1946 Today's Date: 06/10/2014   History of Present Illness  Courtney Hill is a 69 y.o. female who presents with ongoing pain in the low back. 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 Patient has failed multiple forms of conservative care, including RFA procedures.  Clinical Impression  Pt presents with increased pain, decreased mobility and generalized weakness.  Pt feeling better than in previous OT session and is now able to perform transfers and gait with S and RW.  Also performed sit>SL>supine at min/guard level.  Continue to feel she will need acute services to address deficits, however PT recommending HHPT for follow up at d/c.     Follow Up Recommendations Home health PT;Supervision - Intermittent    Equipment Recommendations  None recommended by PT    Recommendations for Other Services       Precautions / Restrictions Precautions Precautions: Fall;Back Precaution Comments: Patient able to state 2/3 back precautions, handout given  Required Braces or Orthoses: Spinal Brace Spinal Brace: Thoracolumbosacral orthotic Restrictions Weight Bearing Restrictions: No      Mobility  Bed Mobility Overal bed mobility: Needs Assistance Bed Mobility: Sit to Sidelying Rolling: Min assist Sidelying to sit: Min assist     Sit to sidelying: Min guard General bed mobility comments: Pt able to manage trunk and LEs with min/guard for safety with HOB flat and without rails.  Min cues and demonstration for correct sequencing and technique to maintain back precautions.   Transfers Overall transfer level: Needs assistance Equipment used: Rolling walker (2 wheeled) Transfers: Sit to/from Stand Sit to Stand: Supervision         General transfer comment: Min cues for hand placement and maintaining back precautions.   Ambulation/Gait Ambulation/Gait  assistance: Supervision Ambulation Distance (Feet): 80 Feet Assistive device: Rolling walker (2 wheeled) Gait Pattern/deviations: Step-through pattern;Decreased stride length;Narrow base of support Gait velocity: very slow   General Gait Details: Pt with very slow gait speed and is guarded with all mobility, however ambulated at S level with min cues for posture.   Stairs            Wheelchair Mobility    Modified Rankin (Stroke Patients Only)       Balance Overall balance assessment: Needs assistance Sitting-balance support: Feet supported Sitting balance-Leahy Scale: Good     Standing balance support: During functional activity;Bilateral upper extremity supported Standing balance-Leahy Scale: Good                               Pertinent Vitals/Pain Pain Assessment: 0-10 Pain Score: 3  Pain Location: back  Pain Descriptors / Indicators: Aching;Sore Pain Intervention(s): Premedicated before session;Monitored during session    Home Living Family/patient expects to be discharged to:: Private residence Living Arrangements: Spouse/significant other Available Help at Discharge: Family (patient stated her sister will be available to assist, husband with poor health on on continious 02) Type of Home: House Home Access: Stairs to enter Entrance Stairs-Rails: None Entrance Stairs-Number of Steps: 1 step Home Layout: One level Home Equipment: Bedside commode;Walker - 2 wheels;Walker - 4 wheels;Shower seat      Prior Function Level of Independence: Independent               Hand Dominance   Dominant Hand: Right    Extremity/Trunk Assessment   Upper Extremity  Assessment: Generalized weakness           Lower Extremity Assessment: Generalized weakness      Cervical / Trunk Assessment: Normal  Communication   Communication: No difficulties  Cognition Arousal/Alertness: Awake/alert Behavior During Therapy: WFL for tasks  assessed/performed Overall Cognitive Status: Within Functional Limits for tasks assessed                      General Comments      Exercises        Assessment/Plan    PT Assessment Patient needs continued PT services  PT Diagnosis Difficulty walking;Generalized weakness;Acute pain   PT Problem List Decreased strength;Decreased activity tolerance;Decreased balance;Decreased mobility;Decreased knowledge of use of DME;Decreased knowledge of precautions;Pain  PT Treatment Interventions DME instruction;Gait training;Stair training;Functional mobility training;Therapeutic activities;Therapeutic exercise;Balance training;Patient/family education   PT Goals (Current goals can be found in the Care Plan section) Acute Rehab PT Goals Patient Stated Goal: to go home PT Goal Formulation: With patient/family Time For Goal Achievement: 06/17/14 Potential to Achieve Goals: Good    Frequency Min 5X/week   Barriers to discharge        Co-evaluation               End of Session Equipment Utilized During Treatment: Back brace Activity Tolerance: Patient limited by fatigue Patient left: in bed;with call bell/phone within reach;with family/visitor present Nurse Communication: Mobility status         Time: 2956-2130 PT Time Calculation (min) (ACUTE ONLY): 38 min   Charges:   PT Evaluation $Initial PT Evaluation Tier I: 1 Procedure PT Treatments $Gait Training: 8-22 mins $Therapeutic Activity: 8-22 mins   PT G Codes:        Vista Deck 06/10/2014, 1:50 PM

## 2014-06-10 NOTE — Evaluation (Signed)
Occupational Therapy Evaluation Patient Details Name: Courtney Hill MRN: [id number] DOB: 03/16/1946 Today's Date: 06/10/2014    History of Present Illness Courtney Hill is a 69 y.o. female who presents with ongoing pain in the low back. 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 Patient has failed multiple forms of conservative care, including RFA procedures.   Clinical Impression   Patient admitted with above. Patient independent PTA. Patient currently functioning at an overall moderate assist > total assist level. Please see OT problem list below. Feel patient will benefit from acute OT to increase overall independence in the areas of ADLs & functional mobility and in order to safely discharge to venue listed below. Please see ADL comment for more information on OT eval/treatment session.      Follow Up Recommendations  CIR;Home health OT;Supervision/Assistance - 24 hour (CIR vs HHOT; pt refusing therapist's recommendation of CIR at this time. ) Plan to follow-up with patient tomorrow to finalize d/c plan.    Equipment Recommendations    None at this time   Recommendations for Other Services Rehab consult     Precautions / Restrictions Precautions Precautions: Fall;Back Precaution Comments: Patient able to state 2/3 back precautions, handout given  Required Braces or Orthoses: Spinal Brace Spinal Brace: Thoracolumbosacral orthotic Restrictions Weight Bearing Restrictions: No      Mobility Bed Mobility Overal bed mobility: Needs Assistance Bed Mobility: Rolling;Sidelying to Sit Rolling: Min assist Sidelying to sit: Min assist  Transfers Overall transfer level: Needs assistance Equipment used: Rolling walker (2 wheeled) Transfers: Sit to/from Stand Sit to Stand: Mod assist    Balance Overall balance assessment: Needs assistance See PT evaluation for more information.     ADL Overall ADL's : Needs  assistance/impaired Eating/Feeding: Independent   Grooming: Minimal assistance;Standing   Upper Body Bathing: Min guard;Sitting   Lower Body Bathing: Sit to/from stand;Total assistance   Upper Body Dressing : Min guard;Sitting   Lower Body Dressing: Sit to/from stand;Total assistance   Toilet Transfer: Moderate assistance;RW;Stand-pivot     General ADL Comments: Patient currently requires up to total assist for ADLs at this time. Patient also requires up to moderate assistance to adhere to back precautions during functional tasks and functional mobility. Patient's husband present during session and in power chair with continous 02. Patient states her sister is in town from [place] for the next couple of weeks to assist. At this time, clinically recommendation is CIR for comprehensive rehab to get patient back to a mod I level and for a better quaility of life. Patient refuses CIR at this time. If she continues to refuse, recommend HHOT and 24/7 supervision/assistance.      Vision  Patient wears glasses all the time, no apparent deficits. No change from baseline.    Perception Perception Perception Tested?: No   Praxis Praxis Praxis tested?: Within functional limits    Pertinent Vitals/Pain Pain Assessment: No/denies pain     Hand Dominance Right   Extremity/Trunk Assessment Upper Extremity Assessment Upper Extremity Assessment: Generalized weakness   Lower Extremity Assessment Lower Extremity Assessment: Defer to PT evaluation   Cervical / Trunk Assessment Cervical / Trunk Assessment: Normal   Communication Communication Communication: No difficulties   Cognition Arousal/Alertness: Awake/alert Behavior During Therapy: WFL for tasks assessed/performed Overall Cognitive Status: Within Functional Limits for tasks assessed             Home Living Family/patient expects to be discharged to:: Private  residence Living Arrangements: Spouse/significant  other Available Help at Discharge: Family (patient stated her sister will be available to assist, husband with poor health on on continious 02) Type of Home: House Home Access: Stairs to enter [employer] of Steps: 1 step Entrance Stairs-Rails: None Home Layout: One level     Bathroom Shower/Tub: Walk-in shower;Door   [employer] Toilet: Handicapped height Bathroom Accessibility: Yes How Accessible: Accessible via walker Home Equipment: Bedside commode;Walker - 2 wheels;Walker - 4 wheels;Shower seat          Prior Functioning/Environment Level of Independence: Independent     OT Diagnosis: Generalized weakness;Acute pain   OT Problem List: Decreased strength;Decreased activity tolerance;Impaired balance (sitting and/or standing);Decreased coordination;Decreased safety awareness;Decreased knowledge of precautions;Decreased knowledge of use of DME or AE;Pain   OT Treatment/Interventions: Self-care/ADL training;Therapeutic exercise;Energy conservation;DME and/or AE instruction;Therapeutic activities;Patient/family education;Balance training    OT Goals(Current goals can be found in the care plan section) Acute Rehab OT Goals Patient Stated Goal: to go home OT Goal Formulation: With patient Time For Goal Achievement: 06/24/14 Potential to Achieve Goals: Good  OT Frequency: Min 2X/week   Barriers to D/C:    None known at this time          End of Session Equipment Utilized During Treatment: Gait belt;Rolling walker;Back brace Nurse Communication: Mobility status  Activity Tolerance: Patient tolerated treatment well Patient left: in chair;with call bell/phone within reach   Time: 1105-1150 OT Time Calculation (min): 45 min Charges:  OT General Charges $OT Visit: 1 Procedure OT Evaluation $Initial OT Evaluation Tier I: 1 Procedure OT Treatments $Self Care/Home Management : 23-37 mins Garnie Borchardt , MS, OTR/L, CLT  06/10/2014, 11:54 AM

## 2014-06-10 NOTE — Progress Notes (Signed)
    Patient doing well Patient feels excellent Minimal pain Previous b/l thigh numbness resolved   Physical Exam: Filed Vitals:   06/10/14 0446  BP: 101/55  Pulse: 76  Temp: 97.5 F (36.4 C)  Resp: 13    Dressing in place NVI with + KE and DF, PF  POD #1 s/p A/P L4/5 fusion, doing great  - up with PT/OT, encourage ambulation - Percocet for pain, Valium for muscle spasms - likely d/c home tomorrow  - back precautions at all times with brace

## 2014-06-11 MED ORDER — PROMETHAZINE HCL 25 MG/ML IJ SOLN: 12.5000 mg | Freq: Four times a day (QID) | INTRAMUSCULAR | Status: DC | PRN

## 2014-06-11 MED ORDER — PROMETHAZINE HCL 25 MG PO TABS: 25.0000 mg | ORAL_TABLET | Freq: Four times a day (QID) | ORAL | Status: DC | PRN

## 2014-06-11 MED ORDER — PROMETHAZINE HCL 25 MG PO TABS: 12.5000 mg | ORAL_TABLET | Freq: Four times a day (QID) | ORAL | Status: DC | PRN

## 2014-06-11 MED ORDER — HYDROMORPHONE HCL 2 MG PO TABS: 1.0000 mg | ORAL_TABLET | ORAL | Status: DC | PRN

## 2014-06-11 MED ORDER — OXYCODONE HCL ER 10 MG PO T12A
10.0000 mg | EXTENDED_RELEASE_TABLET | Freq: Two times a day (BID) | ORAL | Status: DC
  Administered 2014-06-11: 10 mg via ORAL
  Filled 2014-06-11: qty 1

## 2014-06-11 MED ORDER — SODIUM CHLORIDE 0.45 % IV BOLUS: 500.0000 mL | Freq: Once | INTRAVENOUS | Status: DC

## 2014-06-11 MED ORDER — PROMETHAZINE HCL 25 MG RE SUPP: 12.5000 mg | Freq: Four times a day (QID) | RECTAL | Status: DC | PRN

## 2014-06-11 NOTE — Progress Notes (Signed)
    Patient reports expected PO LBP, difficulty gaining adequate px control over night and with PO meds. Minimal dietary and fluid intake, mild nausea. NL B/B function, denies SOB/Ch Px/Lightheadedness/V. Made good gains with PT yesterday HH is recommended. Tolerating brace well    Physical Exam: BP 111/50 mmHg  Pulse 111  Temp(Src) 99.4 F (37.4 C) (Oral)  Resp 16  Ht 5' (1.524 m)  Wt 91.627 kg (202 lb)  BMI 39.45 kg/m2  SpO2 93%  Dressing in place, mild peeling up of tape. Pt laying in bed mild distress secondary to pain and nausea. SCD's in place, NVI  POD #2 s/p L Lat/Pos Fusion improved pre-op leg px expected PO LBP  - Difficult gaining px control with PO meds,   -Add Oxycontin q 12 while in hospital  -Cont dilaudid and valium, pt report hx of nausea with percocet in past but eventually resolved and amenable to trialing percocet id dilaudid cont to not provide adequate coverage  -Cont Valium - up with PT/OT, encourage ambulation,   -D/C with HH - 500cc Fluid bolus to be given  -Pt encouraged to increase PO intake - likely d/c home after PT today vs tomorrow pending PT and px control

## 2014-06-11 NOTE — Progress Notes (Signed)
    Patient doing well + expected LBP + nausea yesterday, non currently Has been ambulating   Physical Exam: Filed Vitals:   06/11/14 0541  BP: 111/50  Pulse: 111  Temp: 99.4 F (37.4 C)  Resp: 16    Dressing in place NVI  POD #2 s/p L4/5 A/P fusion  - up with PT/OT, encourage ambulation - Dilaudid for pain, Valium for muscle spasms - likely d/c home today, if not tomorrow - phenergan/zofran for nausea

## 2014-06-11 NOTE — Progress Notes (Signed)
Physical Therapy Treatment Patient Details Name: Courtney Hill MRN: [id number] DOB: 07/05/1945 Today's Date: 06/11/2014    History of Present Illness Courtney Hill is a 69 y.o. female who presents with ongoing pain in the low back. 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 Patient has failed multiple forms of conservative care, including RFA procedures.    PT Comments    Pt making good progress with all aspects of mobility.  Note increased pain in LLE today, therefore provided education on recovery following back surgery.  Sister present for hands on training with stair negotiation and bed mobility.  Note that she will be with pt 24/7 following possible D/C today for a couple of weeks.   Follow Up Recommendations  Home health PT;Supervision - Intermittent     Equipment Recommendations  None recommended by PT    Recommendations for Other Services       Precautions / Restrictions Precautions Precautions: Fall;Back Precaution Comments: Continue to educate on back precautions during functional activities.  Required Braces or Orthoses: Spinal Brace Spinal Brace: Thoracolumbosacral orthotic Restrictions Weight Bearing Restrictions: No Other Position/Activity Restrictions: per pt, brace can be donned sitting EOB.     Mobility  Bed Mobility Overal bed mobility: Needs Assistance Bed Mobility: Rolling;Sidelying to Sit;Sit to Sidelying Rolling: Supervision Sidelying to sit: Min assist     Sit to sidelying: Min guard General bed mobility comments: Pt continues to require min/guard guiding assist for LEs into bed, however did well with rolling and lowering LEs back out of bed with min A to guide trunk into sitting.   Transfers Overall transfer level: Needs assistance Equipment used: Rolling walker (2 wheeled) Transfers: Sit to/from Stand Sit to Stand: Supervision         General transfer comment: Min cues for hand placement and  maintaining back precautions.   Ambulation/Gait Ambulation/Gait assistance: Supervision Ambulation Distance (Feet): 150 Feet (with two seated rest breaks. ) Assistive device: Rolling walker (2 wheeled) Gait Pattern/deviations: Step-through pattern;Decreased stride length;Shuffle;Narrow base of support Gait velocity: very slow   General Gait Details: Pt continues to have very slow gait speed and provided S for safety due to increased pain in LLE today, however continues to be very steady and no LOB.    Stairs Stairs: Yes Stairs assistance: Min guard Stair Management: No rails;Step to pattern;Backwards;With walker Number of Stairs: 1 General stair comments: Educated sister on how to assist pt up/down single step with cues for each of them for sequencing and technique.  Sister and pt return demosntration at min/guard level.   Wheelchair Mobility    Modified Rankin (Stroke Patients Only)       Balance Overall balance assessment: Modified Independent                                  Cognition Arousal/Alertness: Awake/alert Behavior During Therapy: WFL for tasks assessed/performed Overall Cognitive Status: Within Functional Limits for tasks assessed                      Exercises      General Comments General comments (skin integrity, edema, etc.): Continue to educate on proper way to don/doff brace and recommend that they only undo one side to avoid issues with lining brace up again.       Pertinent Vitals/Pain Pain Assessment: 0-10 Pain Score: 4  Pain Location:  back and LLE Pain Descriptors / Indicators: Aching;Sore Pain Intervention(s): Premedicated before session;Repositioned    Home Living                      Prior Function            PT Goals (current goals can now be found in the care plan section) Acute Rehab PT Goals Patient Stated Goal: to go home PT Goal Formulation: With patient/family Time For Goal Achievement:  06/17/14 Potential to Achieve Goals: Good Progress towards PT goals: Progressing toward goals    Frequency  Min 5X/week    PT Plan Current plan remains appropriate    Co-evaluation             End of Session Equipment Utilized During Treatment: Back brace Activity Tolerance: Patient limited by pain Patient left: in chair;with call bell/phone within reach;with family/visitor present     Time: 1610-9604 PT Time Calculation (min) (ACUTE ONLY): 41 min  Charges:  $Gait Training: 23-37 mins $Therapeutic Activity: 8-22 mins                    G Codes:      Vista Deck 06/11/2014, 10:10 AM

## 2014-06-11 NOTE — Care Management Note (Addendum)
Page 2 of 2   06/11/2014     11:11:21 AM CARE MANAGEMENT NOTE 06/11/2014  Patient:  Courtney Hill,Courtney Hill   Account Number:  [id number]  Date Initiated:  06/11/2014  Documentation initiated by:  Ronny FlurryWILE,Yesica Kemler  Subjective/Objective Assessment:     Action/Plan:   Anticipated DC Date:  06/12/2014   Anticipated DC Plan:  HOME W HOME HEALTH SERVICES         Choice offered to / List presented to:             Status of service:   Medicare Important Message given?  YES (If response is "NO", the following Medicare IM given date Kines will be blank) Date Medicare IM given:  06/11/2014 Medicare IM given by:  Ronny FlurryWILE,Tyri Elmore Date Additional Medicare IM given:   Additional Medicare IM given by:    Discharge Disposition:    Per UR Regulation:  Reviewed for med. necessity/level of care/duration of stay  If discussed at Long Length of Stay Meetings, dates discussed:    06-11-14 Home health orders not signed by MD as of yet , faxed to Workers Comp with MD contact information. Ronny FlurryHeather Halston Fairclough RN BSN    06-11-14 [facility] will see patient under her Medicare while waiting for Worker's Comp decision . Patient aware. OT now recommending hospital bed, patient wants same. Called DR Dumonski's office for order and to have home health orders signed to fax to worker's comp .   Kayla McKenzie instructed NCM to tell patient they will not order hospital bed at this time . Home health PT/OT has to see her first and confirm she needs one. Ronny FlurryHeather Andris Brothers RN BSN    Ronny FlurryHeather Hani Campusano RN BSN   Comments:  06-11-14 Paula ComptonKarla from Dr Dumonski's office called back , she will not be setting up home health provided number for Department of [employer] 1 [phone] , called same provided with phone number [phone]. Called spoke with Trish instructed to print PT authorization form from [email or web address]. Same done and completed with assistance from [facility] and Jason CoopKayla McKenzie PA .  Rosann Auerbachrish stated if referral form faxed today they will  not receive it until Monday Jun 14, 2014 , then authorization takes up to another 24 to 72 hours .  Patient and McKenzie PA aware .  Will fax  ( [phone]) as soon as MD signs [place] health orders in [place] , McKenzie [place] aware.  McKenzie PA instructed NCM to put [facility] office name and number on PT authorization form for follow up .   Patient's case number is [id number] and date of injury is 05-04-04  Ronny FlurryHeather Javia Dillow RN BSN [phone]     06-11-14 Spoke with Paula ComptonKarla at Dr [employer] office , patient is Worker's Comp through the Physicist, medicalDepartment of Labor . Paula ComptonKarla will fax home health orders to [employer] for [employer] and have home health set up . Paula ComptonKarla will call patient this afternoon with home health information . Patient and family aware.  Ronny FlurryHeather Hiilei Gerst RN BSN     06-10-14 Confirmed face sheet information with patient. IM given . Patient wants [facility] for home health services.  However, patient stated her payor source is Worker's Comp , her case Production designer, theatre/television/filmmanager with Workers Comp is Erma Pintoathy Ball , patient does not know Ms Ball's contact information , but stated Roselyn BeringDebbie Witherspoon at Dr Yevette Edwardsumonski 's office knows . Will call same after 0900 .   [facility] aware [facility]  needs to have home health approved by Workers Comp , and [employer] picks home health agency.   Ronny Flurry RN BSN

## 2014-06-11 NOTE — Progress Notes (Signed)
Occupational Therapy Treatment Patient Details Name: Courtney Hill Guinyard MRN: [id number] DOB: 12/22/1945 Today's Date: 06/11/2014    History of present illness Courtney Hill Melland is a 69 y.o. female who presents with ongoing pain in the low back. 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 Patient has failed multiple forms of conservative care, including RFA procedures.   OT comments  Pt. And caregiver were ed. On use of AE for LE bathing and dressing and was able to return demo. Pt. And caregiver were ed. On use of toilet aide for peri hygiene. Pt. Is unable to clean back peri area and adhere to back precautions. Pt. And caregiver were ed. On proper hand placement for sit to stand from various surfaces. Pt. And caregiver expressed confidence in ability to manage at home.   Follow Up Recommendations  Home health OT    Equipment Recommendations  Hospital bed    Recommendations for Other Services      Precautions / Restrictions Precautions Precautions: Fall;Back Precaution Comments: Continue to educate on back precautions during functional activities.  Required Braces or Orthoses: Spinal Brace Spinal Brace: Thoracolumbosacral orthotic Restrictions Weight Bearing Restrictions: No Other Position/Activity Restrictions: per pt, brace can be donned sitting EOB.        Mobility Bed Mobility Overal bed mobility: Needs Assistance Bed Mobility: Rolling;Sidelying to Sit;Sit to Sidelying Rolling: Supervision Sidelying to sit: Min assist     Sit to sidelying: Min assist General bed mobility comments: Pt. and  caregiver ed.on technique.  Transfers Overall transfer level: Needs assistance Equipment used: Rolling walker (2 wheeled) Transfers: Sit to/from Stand Sit to Stand: Min guard         General transfer comment: cues for proper technique and hand placemenat.    Balance Overall balance assessment: Modified Independent                                  ADL       Grooming: Wash/dry hands;Wash/dry face;Brushing hair;Supervision/safety       Lower Body Bathing: Minimal assistance;With adaptive equipment;Cueing for back precautions;Sit to/from stand       Lower Body Dressing: Minimal assistance;With adaptive equipment;Cueing for back precautions;Sit to/from stand   Toilet Transfer: Min guard;Cueing for safety;Ambulation;BSC;RW             General ADL Comments: Pt. and caregiver ed.on back precautions.      Vision                     Perception     Praxis      Cognition   Behavior During Therapy: WFL for tasks assessed/performed Overall Cognitive Status: Within Functional Limits for tasks assessed                       Extremity/Trunk Assessment               Exercises     Shoulder Instructions       General Comments      Pertinent Vitals/ Pain       Pain Assessment: 0-10 Pain Score: 5  Pain Location:  (back and L theigh) Pain Descriptors / Indicators: Aching;Constant Pain Intervention(s): Premedicated before session  Home Living  Prior Functioning/Environment              Frequency       Progress Toward Goals  OT Goals(current goals can now be found in the care plan section)  Progress towards OT goals: Progressing toward goals  Acute Rehab OT Goals Patient Stated Goal: to go home with family A  Plan Discharge plan remains appropriate    Co-evaluation                 End of Session Equipment Utilized During Treatment: Rolling walker;Back brace   Activity Tolerance Patient tolerated treatment well   Patient Left in bed;with call bell/phone within reach;with nursing/sitter in room   Nurse Communication          Time: 2841-3244 OT Time Calculation (min): 44 min  Charges: OT General Charges $OT Visit: 1 Procedure OT Treatments $Self Care/Home Management : 23-37  mins $Therapeutic Activity: 8-22 mins  Phala Schraeder 06/11/2014, 11:17 AM

## 2014-06-12 DIAGNOSIS — Z4789 Encounter for other orthopedic aftercare: Secondary | ICD-10-CM | POA: Diagnosis not present

## 2014-06-12 DIAGNOSIS — K219 Gastro-esophageal reflux disease without esophagitis: Secondary | ICD-10-CM | POA: Diagnosis not present

## 2014-06-14 DIAGNOSIS — Z4789 Encounter for other orthopedic aftercare: Secondary | ICD-10-CM | POA: Diagnosis not present

## 2014-06-14 DIAGNOSIS — K219 Gastro-esophageal reflux disease without esophagitis: Secondary | ICD-10-CM | POA: Diagnosis not present

## 2014-06-15 DIAGNOSIS — K219 Gastro-esophageal reflux disease without esophagitis: Secondary | ICD-10-CM | POA: Diagnosis not present

## 2014-06-15 DIAGNOSIS — Z4789 Encounter for other orthopedic aftercare: Secondary | ICD-10-CM | POA: Diagnosis not present

## 2014-06-16 DIAGNOSIS — Z4789 Encounter for other orthopedic aftercare: Secondary | ICD-10-CM | POA: Diagnosis not present

## 2014-06-16 DIAGNOSIS — K219 Gastro-esophageal reflux disease without esophagitis: Secondary | ICD-10-CM | POA: Diagnosis not present

## 2014-06-17 DIAGNOSIS — K219 Gastro-esophageal reflux disease without esophagitis: Secondary | ICD-10-CM | POA: Diagnosis not present

## 2014-06-17 DIAGNOSIS — Z4789 Encounter for other orthopedic aftercare: Secondary | ICD-10-CM | POA: Diagnosis not present

## 2014-06-17 NOTE — Discharge Summary (Signed)
Patient ID: Courtney Hill MRN: [id number] DOB/AGE: 70-Apr-1947 70 y.o.  Admit date: 06/09/2014 Discharge date: 06/11/2014  Admission Diagnoses:  Active Problems:   Facet hypertrophy   Discharge Diagnoses:  Same  Past Medical History  Diagnosis Date  . Chronic back pain   . Carpal tunnel syndrome   . GERD (gastroesophageal reflux disease)   . History of hiatal hernia   . Arthritis     spine   . History of blood transfusion 1972    post miscarriage     Surgeries: Procedure(s): ANTERIOR LATERAL LUMBAR FUSION 1 LEVEL POSTERIOR LUMBAR FUSION L4-5 LEVEL on 06/09/2014   Consultants: None  Discharged Condition: Improved  Hospital Course: Courtney Hill is an 69 y.o. female who was admitted 06/09/2014 for operative treatment of low back pain/ASD. Patient has severe unremitting pain that affects sleep, daily activities, and work/hobbies. After pre-op clearance the patient was taken to the operating room on 06/09/2014 and underwent  Procedure(s): ANTERIOR LATERAL LUMBAR FUSION L4-5 LEVEL POSTERIOR LUMBAR FUSION L4-5 LEVEL.    Patient was given perioperative antibiotics:  Anti-infectives    Start     Dose/Rate Route Frequency Ordered Stop   06/09/14 2000  ceFAZolin (ANCEF) IVPB 1 g/50 mL premix     1 g100 mL/hr over 30 Minutes Intravenous Every 8 hours 06/09/14 1616 06/10/14 0519   06/09/14 0600  ceFAZolin (ANCEF) IVPB 2 g/50 mL premix     2 g100 mL/hr over 30 Minutes Intravenous On call to O.R. 06/08/14 1404 06/09/14 1230       Patient was given sequential compression devices, early ambulation to prevent DVT.  Patient benefited maximally from hospital stay and there were no complications.    Recent vital signs: BP 124/53 mmHg  Pulse 85  Temp(Src) 99.4 F (37.4 C) (Oral)  Resp 17  Ht 5' (1.524 m)  Wt 91.627 kg (202 lb)  BMI 39.45 kg/m2  SpO2 96%   Discharge Medications:     Medication List    TAKE these medications        HYDROmorphone 2 MG tablet  Commonly known  as:  DILAUDID  Take 0.5-1 tablets (1-2 mg total) by mouth every 4 (four) hours as needed for severe pain.     omeprazole 20 MG capsule  Commonly known as:  PRILOSEC  Take 20 mg by mouth daily before breakfast.     pantoprazole 40 MG tablet  Commonly known as:  PROTONIX  Take 1 tablet (40 mg total) by mouth daily.     promethazine 25 MG tablet  Commonly known as:  PHENERGAN  Take 1 tablet (25 mg total) by mouth every 6 (six) hours as needed for nausea or vomiting.        Diagnostic Studies: Dg Chest 2 View  06/02/2014   CLINICAL DATA:  Preoperative exam prior to lumbar spine surgery; no history of cardiopulmonary abnormality; nonsmoker  EXAM: CHEST  2 VIEW  COMPARISON:  PA and lateral chest x-ray of February 13, 2013.  FINDINGS: The lungs are adequately inflated. There is no focal infiltrate. The cardiac silhouette is top-normal in size. The pulmonary vascularity is not engorged. The mediastinum is normal in width. The bony thorax is unremarkable.  IMPRESSION: There is no active cardiopulmonary disease.   Electronically Signed   By: Courtney Hill  [place]   On: 06/02/2014 12:59   Dg Lumbar Spine Complete  06/09/2014   CLINICAL DATA:  Left L4-5 XLIF  EXAM: DG C-ARM GT 120 MIN; LUMBAR SPINE -  COMPLETE 4+ VIEW  COMPARISON:  05/20/2014  FINDINGS: Four spot films were obtained during placement of an the lateral and posterior fixation devices at L4-5 with interbody fusion. Changes of prior fusion at L5-S1 are again seen and stable. No hardware abnormality is noted.  FLUOROSCOPY TIME:  4 min 48 seconds  IMPRESSION: Postoperative change without acute abnormality.   Electronically Signed   By: Courtney Hill  Lukens M.D.   On: 06/09/2014 14:45   Dg C-arm Gt 120 Min  06/09/2014   CLINICAL DATA:  Left L4-5 XLIF  EXAM: DG C-ARM GT 120 MIN; LUMBAR SPINE - COMPLETE 4+ VIEW  COMPARISON:  05/20/2014  FINDINGS: Four spot films were obtained during placement of an the lateral and posterior fixation devices at L4-5 with interbody  fusion. Changes of prior fusion at L5-S1 are again seen and stable. No hardware abnormality is noted.  FLUOROSCOPY TIME:  4 min 48 seconds  IMPRESSION: Postoperative change without acute abnormality.   Electronically Signed   By: Courtney Hill  Lukens M.D.   On: 06/09/2014 14:45    Disposition: 06-Home-Health Care Svc   POD #2 s/p L Lat/Pos Fusion improved pre-op leg px expected PO LBP  - Difficult gaining px control with PO meds,  -Add Oxycontin q 12 while in hospital -Cont dilaudid and valium - up with PT/OT, encourage ambulation,  -D/C with HH - 500cc Fluid bolus to be given -Pt encouraged to increase PO intake -Written scripts for pain signed and in chart -D/C instructions sheet printed and in chart -D/C today  -F/U in office 2 weeks   Signed: Georga Hill, Courtney Hill 06/17/2014, 1:57 PM

## 2014-06-18 DIAGNOSIS — Z4789 Encounter for other orthopedic aftercare: Secondary | ICD-10-CM | POA: Diagnosis not present

## 2014-06-18 DIAGNOSIS — K219 Gastro-esophageal reflux disease without esophagitis: Secondary | ICD-10-CM | POA: Diagnosis not present

## 2014-06-22 DIAGNOSIS — Z4789 Encounter for other orthopedic aftercare: Secondary | ICD-10-CM | POA: Diagnosis not present

## 2014-06-22 DIAGNOSIS — K219 Gastro-esophageal reflux disease without esophagitis: Secondary | ICD-10-CM | POA: Diagnosis not present

## 2014-06-24 DIAGNOSIS — K219 Gastro-esophageal reflux disease without esophagitis: Secondary | ICD-10-CM | POA: Diagnosis not present

## 2014-06-24 DIAGNOSIS — Z4789 Encounter for other orthopedic aftercare: Secondary | ICD-10-CM | POA: Diagnosis not present

## 2014-08-28 DIAGNOSIS — J029 Acute pharyngitis, unspecified: Secondary | ICD-10-CM | POA: Diagnosis not present

## 2014-08-28 DIAGNOSIS — Z0000 Encounter for general adult medical examination without abnormal findings: Secondary | ICD-10-CM | POA: Diagnosis not present

## 2014-11-29 ENCOUNTER — Other Ambulatory Visit: Payer: Self-pay

## 2015-02-14 ENCOUNTER — Other Ambulatory Visit (HOSPITAL_COMMUNITY): Payer: Self-pay | Admitting: Family Medicine

## 2015-02-14 DIAGNOSIS — Z1231 Encounter for screening mammogram for malignant neoplasm of breast: Secondary | ICD-10-CM

## 2015-02-16 ENCOUNTER — Ambulatory Visit (HOSPITAL_COMMUNITY)
Admission: RE | Admit: 2015-02-16 | Discharge: 2015-02-16 | Disposition: A | Discharge status: Home / Self Care | Payer: Medicare Other | Source: Ambulatory Visit | Attending: Family Medicine | Admitting: Family Medicine

## 2015-02-16 DIAGNOSIS — Z1231 Encounter for screening mammogram for malignant neoplasm of breast: Secondary | ICD-10-CM | POA: Insufficient documentation

## 2015-04-06 DIAGNOSIS — M25532 Pain in left wrist: Secondary | ICD-10-CM | POA: Diagnosis not present

## 2015-04-06 DIAGNOSIS — M778 Other enthesopathies, not elsewhere classified: Secondary | ICD-10-CM | POA: Diagnosis not present

## 2015-05-04 ENCOUNTER — Other Ambulatory Visit: Payer: Self-pay | Admitting: Gastroenterology

## 2015-05-04 DIAGNOSIS — K621 Rectal polyp: Secondary | ICD-10-CM | POA: Diagnosis not present

## 2015-05-04 DIAGNOSIS — K5730 Diverticulosis of large intestine without perforation or abscess without bleeding: Secondary | ICD-10-CM | POA: Diagnosis not present

## 2015-05-04 DIAGNOSIS — Z1211 Encounter for screening for malignant neoplasm of colon: Secondary | ICD-10-CM | POA: Diagnosis not present

## 2015-05-06 DIAGNOSIS — K621 Rectal polyp: Secondary | ICD-10-CM | POA: Diagnosis not present

## 2015-06-01 ENCOUNTER — Encounter: Payer: Self-pay | Admitting: *Deleted

## 2016-04-07 DIAGNOSIS — R0781 Pleurodynia: Secondary | ICD-10-CM | POA: Diagnosis not present

## 2016-04-23 DIAGNOSIS — R0781 Pleurodynia: Secondary | ICD-10-CM | POA: Diagnosis not present

## 2016-06-13 DIAGNOSIS — H2513 Age-related nuclear cataract, bilateral: Secondary | ICD-10-CM | POA: Diagnosis not present

## 2016-11-02 DIAGNOSIS — M84376A Stress fracture, unspecified foot, initial encounter for fracture: Secondary | ICD-10-CM | POA: Diagnosis not present

## 2016-11-08 DIAGNOSIS — M79672 Pain in left foot: Secondary | ICD-10-CM | POA: Diagnosis not present

## 2016-11-22 DIAGNOSIS — M79672 Pain in left foot: Secondary | ICD-10-CM | POA: Diagnosis not present

## 2016-12-13 DIAGNOSIS — M79672 Pain in left foot: Secondary | ICD-10-CM | POA: Diagnosis not present

## 2016-12-18 DIAGNOSIS — M25572 Pain in left ankle and joints of left foot: Secondary | ICD-10-CM | POA: Diagnosis not present

## 2016-12-18 DIAGNOSIS — R262 Difficulty in walking, not elsewhere classified: Secondary | ICD-10-CM | POA: Diagnosis not present

## 2016-12-18 DIAGNOSIS — S93402D Sprain of unspecified ligament of left ankle, subsequent encounter: Secondary | ICD-10-CM | POA: Diagnosis not present

## 2017-04-09 DIAGNOSIS — M1712 Unilateral primary osteoarthritis, left knee: Secondary | ICD-10-CM | POA: Diagnosis not present

## 2017-04-30 DIAGNOSIS — M25562 Pain in left knee: Secondary | ICD-10-CM | POA: Diagnosis not present

## 2017-05-16 DIAGNOSIS — M1712 Unilateral primary osteoarthritis, left knee: Secondary | ICD-10-CM | POA: Diagnosis not present

## 2017-05-23 DIAGNOSIS — M1712 Unilateral primary osteoarthritis, left knee: Secondary | ICD-10-CM | POA: Diagnosis not present

## 2017-06-01 DIAGNOSIS — M1712 Unilateral primary osteoarthritis, left knee: Secondary | ICD-10-CM | POA: Diagnosis not present

## 2017-06-20 DIAGNOSIS — H524 Presbyopia: Secondary | ICD-10-CM | POA: Diagnosis not present

## 2017-06-20 DIAGNOSIS — H2513 Age-related nuclear cataract, bilateral: Secondary | ICD-10-CM | POA: Diagnosis not present

## 2017-06-27 DIAGNOSIS — M25662 Stiffness of left knee, not elsewhere classified: Secondary | ICD-10-CM | POA: Diagnosis not present

## 2017-06-27 DIAGNOSIS — M25562 Pain in left knee: Secondary | ICD-10-CM | POA: Diagnosis not present

## 2017-06-27 DIAGNOSIS — M1712 Unilateral primary osteoarthritis, left knee: Secondary | ICD-10-CM | POA: Diagnosis not present

## 2017-07-02 DIAGNOSIS — M25562 Pain in left knee: Secondary | ICD-10-CM | POA: Diagnosis not present

## 2017-07-02 DIAGNOSIS — M25662 Stiffness of left knee, not elsewhere classified: Secondary | ICD-10-CM | POA: Diagnosis not present

## 2017-07-04 DIAGNOSIS — M25662 Stiffness of left knee, not elsewhere classified: Secondary | ICD-10-CM | POA: Diagnosis not present

## 2017-07-04 DIAGNOSIS — M25562 Pain in left knee: Secondary | ICD-10-CM | POA: Diagnosis not present

## 2017-07-09 DIAGNOSIS — M25562 Pain in left knee: Secondary | ICD-10-CM | POA: Diagnosis not present

## 2017-07-09 DIAGNOSIS — M25662 Stiffness of left knee, not elsewhere classified: Secondary | ICD-10-CM | POA: Diagnosis not present

## 2017-07-11 DIAGNOSIS — M25562 Pain in left knee: Secondary | ICD-10-CM | POA: Diagnosis not present

## 2017-07-11 DIAGNOSIS — M25662 Stiffness of left knee, not elsewhere classified: Secondary | ICD-10-CM | POA: Diagnosis not present

## 2017-07-16 DIAGNOSIS — M25562 Pain in left knee: Secondary | ICD-10-CM | POA: Diagnosis not present

## 2017-07-16 DIAGNOSIS — M25662 Stiffness of left knee, not elsewhere classified: Secondary | ICD-10-CM | POA: Diagnosis not present

## 2017-07-18 DIAGNOSIS — M25662 Stiffness of left knee, not elsewhere classified: Secondary | ICD-10-CM | POA: Diagnosis not present

## 2017-07-18 DIAGNOSIS — M25562 Pain in left knee: Secondary | ICD-10-CM | POA: Diagnosis not present

## 2017-07-22 DIAGNOSIS — M25562 Pain in left knee: Secondary | ICD-10-CM | POA: Diagnosis not present

## 2017-07-22 DIAGNOSIS — M25662 Stiffness of left knee, not elsewhere classified: Secondary | ICD-10-CM | POA: Diagnosis not present

## 2017-07-25 DIAGNOSIS — M25662 Stiffness of left knee, not elsewhere classified: Secondary | ICD-10-CM | POA: Diagnosis not present

## 2017-07-25 DIAGNOSIS — M25562 Pain in left knee: Secondary | ICD-10-CM | POA: Diagnosis not present

## 2017-08-08 DIAGNOSIS — M25562 Pain in left knee: Secondary | ICD-10-CM | POA: Diagnosis not present

## 2017-11-01 DIAGNOSIS — R1311 Dysphagia, oral phase: Secondary | ICD-10-CM | POA: Diagnosis not present

## 2017-11-01 DIAGNOSIS — K210 Gastro-esophageal reflux disease with esophagitis: Secondary | ICD-10-CM | POA: Diagnosis not present

## 2017-11-01 DIAGNOSIS — J209 Acute bronchitis, unspecified: Secondary | ICD-10-CM | POA: Diagnosis not present

## 2017-11-11 DIAGNOSIS — H25811 Combined forms of age-related cataract, right eye: Secondary | ICD-10-CM | POA: Diagnosis not present

## 2017-11-11 DIAGNOSIS — H25812 Combined forms of age-related cataract, left eye: Secondary | ICD-10-CM | POA: Diagnosis not present

## 2017-11-12 DIAGNOSIS — M25562 Pain in left knee: Secondary | ICD-10-CM | POA: Diagnosis not present

## 2017-11-12 DIAGNOSIS — M1712 Unilateral primary osteoarthritis, left knee: Secondary | ICD-10-CM | POA: Diagnosis not present

## 2017-11-25 DIAGNOSIS — K219 Gastro-esophageal reflux disease without esophagitis: Secondary | ICD-10-CM | POA: Diagnosis not present

## 2017-11-25 DIAGNOSIS — K449 Diaphragmatic hernia without obstruction or gangrene: Secondary | ICD-10-CM | POA: Diagnosis not present

## 2017-11-25 DIAGNOSIS — R1310 Dysphagia, unspecified: Secondary | ICD-10-CM | POA: Diagnosis not present

## 2017-12-11 DIAGNOSIS — H25812 Combined forms of age-related cataract, left eye: Secondary | ICD-10-CM | POA: Diagnosis not present

## 2017-12-11 DIAGNOSIS — H2512 Age-related nuclear cataract, left eye: Secondary | ICD-10-CM | POA: Diagnosis not present

## 2018-01-21 DIAGNOSIS — M1712 Unilateral primary osteoarthritis, left knee: Secondary | ICD-10-CM | POA: Diagnosis not present

## 2018-02-21 ENCOUNTER — Other Ambulatory Visit: Payer: Self-pay | Admitting: Orthopedic Surgery

## 2018-02-24 DIAGNOSIS — H2511 Age-related nuclear cataract, right eye: Secondary | ICD-10-CM | POA: Diagnosis not present

## 2018-02-26 DIAGNOSIS — H2511 Age-related nuclear cataract, right eye: Secondary | ICD-10-CM | POA: Diagnosis not present

## 2018-02-26 DIAGNOSIS — H25811 Combined forms of age-related cataract, right eye: Secondary | ICD-10-CM | POA: Diagnosis not present

## 2018-03-04 DIAGNOSIS — M25562 Pain in left knee: Secondary | ICD-10-CM | POA: Diagnosis not present

## 2018-03-13 ENCOUNTER — Other Ambulatory Visit: Payer: Self-pay | Admitting: Orthopedic Surgery

## 2018-03-13 NOTE — Care Plan (Signed)
Spoke with patient prior to surgery. She will discharge to home with family and HHPT.  She has equipment. CPM will be delivered.   Please contact me with questions.   Shauna Hugh, RNCM  [phone]

## 2018-03-13 NOTE — Patient Instructions (Signed)
Courtney Hill  03/13/2018   Your procedure is scheduled on: 03-21-18   Report to [facility] Main  Entrance    Report to admitting at 5:30AM    Call this number if you have problems the morning of surgery [phone]     Remember: Do not eat food or drink liquids :After Midnight. BRUSH YOUR TEETH MORNING OF SURGERY AND RINSE YOUR MOUTH OUT, NO CHEWING GUM CANDY OR MINTS.     Take these medicines the morning of surgery with A SIP OF WATER: TYLENOL IF NEEDED, OMEPRAZOLE, EYE DROPS                                 You may not have any metal on your body including hair pins and              piercings  Do not wear jewelry, make-up, lotions, powders or perfumes, deodorant             Do not wear nail polish.  Do not shave  48 hours prior to surgery.              Do not bring valuables to the hospital. [facility] IS NOT             RESPONSIBLE   FOR VALUABLES.  Contacts, dentures or bridgework may not be worn into surgery.  Leave suitcase in the car. After surgery it may be brought to your room.                   Please read over the following fact sheets you were given: _____________________________________________________________________             [facility] - Preparing for Surgery Before surgery, you can play an important role.  Because skin is not sterile, your skin needs to be as free of germs as possible.  You can reduce the number of germs on your skin by washing with CHG (chlorahexidine gluconate) soap before surgery.  CHG is an antiseptic cleaner which kills germs and bonds with the skin to continue killing germs even after washing. Please DO NOT use if you have an allergy to CHG or antibacterial soaps.  If your skin becomes reddened/irritated stop using the CHG and inform your nurse when you arrive at Short Stay. Do not shave (including legs and underarms) for at least 48 hours prior to the first CHG shower.  You may shave your  face/neck. Please follow these instructions carefully:  1.  Shower with CHG Soap the night before surgery and the  morning of Surgery.  2.  If you choose to wash your hair, wash your hair first as usual with your  normal  shampoo.  3.  After you shampoo, rinse your hair and body thoroughly to remove the  shampoo.                           4.  Use CHG as you would any other liquid soap.  You can apply chg directly  to the skin and wash                       Gently with a scrungie or clean washcloth.  5.  Apply the CHG Soap to your body ONLY FROM  THE NECK DOWN.   Do not use on face/ open                           Wound or open sores. Avoid contact with eyes, ears mouth and genitals (private parts).                       Wash face,  Genitals (private parts) with your normal soap.             6.  Wash thoroughly, paying special attention to the area where your surgery  will be performed.  7.  Thoroughly rinse your body with warm water from the neck down.  8.  DO NOT shower/wash with your normal soap after using and rinsing off  the CHG Soap.                9.  Pat yourself dry with a clean towel.            10.  Wear clean pajamas.            11.  Place clean sheets on your bed the night of your first shower and do not  sleep with pets. Day of Surgery : Do not apply any lotions/deodorants the morning of surgery.  Please wear clean clothes to the hospital/surgery center.  FAILURE TO FOLLOW THESE INSTRUCTIONS MAY RESULT IN THE CANCELLATION OF YOUR SURGERY PATIENT SIGNATURE_________________________________  NURSE SIGNATURE__________________________________  ________________________________________________________________________   Adam Phenix  An incentive spirometer is a tool that can help keep your lungs clear and active. This tool measures how well you are filling your lungs with each breath. Taking long deep breaths may help reverse or decrease the chance of developing breathing  (pulmonary) problems (especially infection) following:  A long period of time when you are unable to move or be active. BEFORE THE PROCEDURE   If the spirometer includes an indicator to show your best effort, your nurse or respiratory therapist will set it to a desired goal.  If possible, sit up straight or lean slightly forward. Try not to slouch.  Hold the incentive spirometer in an upright position. INSTRUCTIONS FOR USE  1. Sit on the edge of your bed if possible, or sit up as far as you can in bed or on a chair. 2. Hold the incentive spirometer in an upright position. 3. Breathe out normally. 4. Place the mouthpiece in your mouth and seal your lips tightly around it. 5. Breathe in slowly and as deeply as possible, raising the piston or the ball toward the top of the column. 6. Hold your breath for 3-5 seconds or for as long as possible. Allow the piston or ball to fall to the bottom of the column. 7. Remove the mouthpiece from your mouth and breathe out normally. 8. Rest for a few seconds and repeat Steps 1 through 7 at least 10 times every 1-2 hours when you are awake. Take your time and take a few normal breaths between deep breaths. 9. The spirometer may include an indicator to show your best effort. Use the indicator as a goal to work toward during each repetition. 10. After each set of 10 deep breaths, practice coughing to be sure your lungs are clear. If you have an incision (the cut made at the time of surgery), support your incision when coughing by placing a pillow or rolled up towels firmly against it. Once you are  able to get out of bed, walk around indoors and cough well. You may stop using the incentive spirometer when instructed by your caregiver.  RISKS AND COMPLICATIONS  Take your time so you do not get dizzy or light-headed.  If you are in pain, you may need to take or ask for pain medication before doing incentive spirometry. It is harder to take a deep breath if you  are having pain. AFTER USE  Rest and breathe slowly and easily.  It can be helpful to keep track of a log of your progress. Your caregiver can provide you with a simple table to help with this. If you are using the spirometer at home, follow these instructions: Courtney Hill IF:   You are having difficultly using the spirometer.  You have trouble using the spirometer as often as instructed.  Your pain medication is not giving enough relief while using the spirometer.  You develop fever of 100.5 F (38.1 C) or higher. SEEK IMMEDIATE MEDICAL CARE IF:   You cough up bloody sputum that had not been present before.  You develop fever of 102 F (38.9 C) or greater.  You develop worsening pain at or near the incision site. MAKE SURE YOU:   Understand these instructions.  Will watch your condition.  Will get help right away if you are not doing well or get worse. Document Released: 10/01/2006 Document Revised: 08/13/2011 Document Reviewed: 12/02/2006 [facility] Patient Information 2014 [place], [place].   ________________________________________________________________________

## 2018-03-14 ENCOUNTER — Ambulatory Visit (HOSPITAL_COMMUNITY)
Admission: RE | Admit: 2018-03-14 | Discharge: 2018-03-14 | Disposition: A | Discharge status: Home / Self Care | Payer: Medicare Other | Source: Ambulatory Visit | Attending: Orthopedic Surgery | Admitting: Orthopedic Surgery

## 2018-03-14 ENCOUNTER — Other Ambulatory Visit: Payer: Self-pay

## 2018-03-14 ENCOUNTER — Encounter (HOSPITAL_COMMUNITY)
Admission: RE | Admit: 2018-03-14 | Discharge: 2018-03-14 | Disposition: A | Discharge status: Home / Self Care | Payer: Medicare Other | Source: Ambulatory Visit | Attending: Orthopedic Surgery | Admitting: Orthopedic Surgery

## 2018-03-14 ENCOUNTER — Encounter (HOSPITAL_COMMUNITY): Payer: Self-pay

## 2018-03-14 DIAGNOSIS — Z471 Aftercare following joint replacement surgery: Secondary | ICD-10-CM | POA: Diagnosis not present

## 2018-03-14 DIAGNOSIS — Z96652 Presence of left artificial knee joint: Secondary | ICD-10-CM | POA: Diagnosis not present

## 2018-03-14 DIAGNOSIS — Z01818 Encounter for other preprocedural examination: Secondary | ICD-10-CM | POA: Insufficient documentation

## 2018-03-14 HISTORY — DX: Unspecified hearing loss, unspecified ear: H91.90

## 2018-03-14 LAB — URINALYSIS, ROUTINE W REFLEX MICROSCOPIC
BILIRUBIN URINE: NEGATIVE
GLUCOSE, UA: NEGATIVE mg/dL
KETONES UR: NEGATIVE mg/dL
LEUKOCYTES UA: NEGATIVE
Nitrite: NEGATIVE
PH: 5 (ref 5.0–8.0)
Protein, ur: NEGATIVE mg/dL
SPECIFIC GRAVITY, URINE: 1.006 (ref 1.005–1.030)

## 2018-03-14 LAB — CBC WITH DIFFERENTIAL/PLATELET
ABS IMMATURE GRANULOCYTES: 0.04 10*3/uL (ref 0.00–0.07)
BASOS PCT: 0 %
Basophils Absolute: 0 10*3/uL (ref 0.0–0.1)
EOS PCT: 4 %
Eosinophils Absolute: 0.4 10*3/uL (ref 0.0–0.5)
HCT: 41.4 % (ref 36.0–46.0)
HEMOGLOBIN: 13.2 g/dL (ref 12.0–15.0)
Immature Granulocytes: 0 %
LYMPHS PCT: 19 %
Lymphs Abs: 1.9 10*3/uL (ref 0.7–4.0)
MCH: 29.8 pg (ref 26.0–34.0)
MCHC: 31.9 g/dL (ref 30.0–36.0)
MCV: 93.5 fL (ref 80.0–100.0)
Monocytes Absolute: 0.8 10*3/uL (ref 0.1–1.0)
Monocytes Relative: 8 %
NEUTROS ABS: 6.7 10*3/uL (ref 1.7–7.7)
Neutrophils Relative %: 69 %
PLATELETS: 216 10*3/uL (ref 150–400)
RBC: 4.43 MIL/uL (ref 3.87–5.11)
RDW: 12.3 % (ref 11.5–15.5)
WBC: 9.9 10*3/uL (ref 4.0–10.5)
nRBC: 0 % (ref 0.0–0.2)

## 2018-03-14 LAB — COMPREHENSIVE METABOLIC PANEL
ALBUMIN: 4.1 g/dL (ref 3.5–5.0)
ALK PHOS: 50 U/L (ref 38–126)
ALT: 27 U/L (ref 0–44)
ANION GAP: 8 (ref 5–15)
AST: 23 U/L (ref 15–41)
BUN: 21 mg/dL (ref 8–23)
CALCIUM: 9 mg/dL (ref 8.9–10.3)
CHLORIDE: 106 mmol/L (ref 98–111)
CO2: 26 mmol/L (ref 22–32)
CREATININE: 0.74 mg/dL (ref 0.44–1.00)
GFR calc non Af Amer: >60 mL/min (ref >60)
GLUCOSE: 99 mg/dL (ref 70–99)
Potassium: 4.3 mmol/L (ref 3.5–5.1)
SODIUM: 140 mmol/L (ref 135–145)
Total Bilirubin: 0.4 mg/dL (ref 0.3–1.2)
Total Protein: 7 g/dL (ref 6.5–8.1)

## 2018-03-14 LAB — PROTIME-INR
INR: 0.94
Prothrombin Time: 12.5 seconds (ref 11.4–15.2)

## 2018-03-14 LAB — ABO/RH: ABO/RH(D): AB POS

## 2018-03-14 LAB — APTT: APTT: 30 s (ref 24–36)

## 2018-03-14 LAB — SURGICAL PCR SCREEN
MRSA, PCR: POSITIVE — AB
Staphylococcus aureus: POSITIVE — AB

## 2018-03-19 ENCOUNTER — Other Ambulatory Visit: Payer: Self-pay | Admitting: Orthopedic Surgery

## 2018-03-20 NOTE — Anesthesia Preprocedure Evaluation (Addendum)
Anesthesia Evaluation  Patient identified by MRN, date of birth, ID band Patient awake    Reviewed: Allergy & Precautions, NPO status , Patient's Chart, lab work & pertinent test results  History of Anesthesia Complications Negative for: history of anesthetic complications  Airway Mallampati: II  TM Distance: >3 FB Neck ROM: Full    Dental  (+) Dental Advisory Given, Teeth Intact   Pulmonary neg pulmonary ROS,    breath sounds clear to auscultation       Cardiovascular negative cardio ROS   Rhythm:Regular Rate:Normal     Neuro/Psych  Hard of hearing   Neuromuscular disease (carpal tunnel syndrome) negative psych ROS   GI/Hepatic Neg liver ROS, hiatal hernia, GERD  Controlled and Medicated,  Endo/Other  Morbid obesity  Renal/GU negative Renal ROS     Musculoskeletal  (+) Arthritis ,  Chronic back pain s/p multiple lumbar surgeries   Abdominal   Peds  Hematology negative hematology ROS (+)   Anesthesia Other Findings   Reproductive/Obstetrics                           Anesthesia Physical Anesthesia Plan  ASA: III  Anesthesia Plan: Spinal   Post-op Pain Management:  Regional for Post-op pain   Induction:   PONV Risk Score and Plan: 2 and Treatment may vary due to age or medical condition and Propofol infusion  Airway Management Planned: Natural Airway and Simple Face Mask  Additional Equipment: None  Intra-op Plan:   Post-operative Plan:   Informed Consent: I have reviewed the patients History and Physical, chart, labs and discussed the procedure including the risks, benefits and alternatives for the proposed anesthesia with the patient or authorized representative who has indicated his/her understanding and acceptance.     Plan Discussed with: CRNA and Anesthesiologist  Anesthesia Plan Comments: (Labs reviewed, platelets acceptable. Discussed risks and benefits of  spinal, including spinal/epidural hematoma, infection, failed block, and PDPH. Patient expressed understanding and wished to proceed. )      Anesthesia Quick Evaluation

## 2018-03-20 NOTE — H&P (Addendum)
TOTAL KNEE ADMISSION H&P  Patient is being admitted for left total knee arthroplasty.  Subjective:  Chief Complaint:left knee pain.  HPI: Courtney Hill, 72 y.o. female, has a history of pain and functional disability in the left knee due to arthritis and has failed non-surgical conservative treatments for greater than 12 weeks to includeNSAID's and/or analgesics, corticosteriod injections and activity modification.  Onset of symptoms was gradual, starting 8 years ago with gradually worsening course since that time. The patient noted no past surgery on the left knee(s).  Patient currently rates pain in the left knee(s) at 8 out of 10 with activity. Patient has night pain, worsening of pain with activity and weight bearing, pain that interferes with activities of daily living, pain with passive range of motion, crepitus and joint swelling.  Patient has evidence of subchondral sclerosis, periarticular osteophytes and joint space narrowing by imaging studies. This patient has had failure of conservative care. There is no active infection.  Patient Active Problem List   Diagnosis Date Noted  . Facet hypertrophy 06/09/2014  . Chest pain 02/13/2013  . GERD (gastroesophageal reflux disease) 02/13/2013  . Chronic lower back pain 02/13/2013  . Obesity 02/13/2013   Past Medical History:  Diagnosis Date  . Arthritis    spine   . Carpal tunnel syndrome   . Chronic back pain   . GERD (gastroesophageal reflux disease)   . History of blood transfusion 1972   post miscarriage ; no reaction   . History of hiatal hernia   . HOH (hard of hearing)     Past Surgical History:  Procedure Laterality Date  . ABDOMINAL HYSTERECTOMY    . ANTERIOR FUSION LUMBAR SPINE  06/09/2014   L4  L5   . ANTERIOR LAT LUMBAR FUSION N/A 06/09/2014   Procedure: ANTERIOR LATERAL LUMBAR FUSION 1 LEVEL;  Surgeon: Sinclair Ship, MD;  Location: [facility];  Service: Orthopedics;  Laterality: N/A;  Lumbar 4-5 lateral interbody  fusion  . APPENDECTOMY    . BACK SURGERY  2008   fusion  . CARPAL TUNNEL RELEASE Bilateral   . CATARACT EXTRACTION     12-2017 left eye , 02-2018 right eye   . FRACTURE SURGERY     fx _ L wrist, with hardware  . POSTERIOR LUMBAR FUSION  06/09/2014   L4 L5   . TARSAL TUNNEL RELEASE     metal plate put in  . TONSILLECTOMY      No current facility-administered medications for this encounter.    Current Outpatient Medications  Medication Sig Dispense Refill Last Dose  . acetaminophen (TYLENOL) 500 MG tablet Take 500 mg by mouth every 6 (six) hours as needed for moderate pain or headache.     . meloxicam (MOBIC) 15 MG tablet Take 15 mg by mouth daily.     . metaxalone (SKELAXIN) 800 MG tablet Take 800 mg by mouth 2 (two) times daily as needed for muscle spasms.     Marland Kitchen omeprazole (PRILOSEC) 40 MG capsule Take 40 mg by mouth daily.    06/09/2014 at Unknown time  . Prednisolon-Gatiflox-Bromfenac 1-0.5-0.075 % SOLN Place 1 drop into the right eye daily.      No Known Allergies  Social History   Tobacco Use  . Smoking status: Never Smoker  . Smokeless tobacco: Never Used  Substance Use Topics  . Alcohol use: No    No family history on file.   ROS ROS: I have reviewed the patient's review of systems thoroughly and  there are no positive responses as relates to the HPI. Objective:  Physical Exam  Vital signs in last 24 hours:    Vitals:   03/21/18 0605  BP: (!) 146/72  Pulse: 61  Resp: 16  Temp: 98.1 F (36.7 C)  SpO2: 100%    Well-developed well-nourished patient in no acute distress. Alert and oriented x3 HEENT:within normal limits Cardiac: Regular rate and rhythm Pulmonary: Lungs clear to auscultation Abdomen: Soft and nontender.  Normal active bowel sounds  Musculoskeletal: (L knee: painful rom limited rom trace effusion nvi distally Labs: Recent Results (from the past 2160 hour(s))  ABO/Rh     Status: None   Collection Time: 03/14/18 11:57 AM  Result Value Ref  Range   ABO/RH(D)      AB POS Performed at [facility], [place] [address] [place], [place] [address]   Surgical pcr screen     Status: Abnormal   Collection Time: 03/14/18 12:10 PM  Result Value Ref Range   MRSA, PCR POSITIVE (A) NEGATIVE    Comment: RESULT CALLED TO, READ BACK BY AND VERIFIED WITH: CARLA PHILLIPS [id number] @ 1442 BY J SCOTTON    Staphylococcus aureus POSITIVE (A) NEGATIVE    Comment: RESULT CALLED TO, READ BACK BY AND VERIFIED WITH: [place] [id number] @ 2353 BY J SCOTTON (NOTE) The Xpert SA Assay (FDA approved for NASAL specimens in patients 58 years of age and older), is one component of a comprehensive surveillance program. It is not intended to diagnose infection nor to guide or monitor treatment. Performed at [facility], [place] [address] [place], [place] [address]   APTT     Status: None   Collection Time: 03/14/18 12:10 PM  Result Value Ref Range   aPTT 30 24 - 36 seconds    Comment: Performed at [facility], [place] [address], [place], [place] [address]  CBC WITH DIFFERENTIAL     Status: None   Collection Time: 03/14/18 12:10 PM  Result Value Ref Range   WBC 9.9 4.0 - 10.5 K/uL   RBC 4.43 3.87 - 5.11 MIL/uL   Hemoglobin 13.2 12.0 - 15.0 g/dL   HCT 41.4 36.0 - 46.0 %   MCV 93.5 80.0 - 100.0 fL   MCH 29.8 26.0 - 34.0 pg   MCHC 31.9 30.0 - 36.0 g/dL   RDW 12.3 11.5 - 15.5 %   Platelets 216 150 - 400 K/uL   nRBC 0.0 0.0 - 0.2 %   Neutrophils Relative % 69 %   Neutro Abs 6.7 1.7 - 7.7 K/uL   Lymphocytes Relative 19 %   Lymphs Abs 1.9 0.7 - 4.0 K/uL   Monocytes Relative 8 %   Monocytes Absolute 0.8 0.1 - 1.0 K/uL   Eosinophils Relative 4 %   Eosinophils Absolute 0.4 0.0 - 0.5 K/uL   Basophils Relative 0 %   Basophils Absolute 0.0 0.0 - 0.1 K/uL   Immature Granulocytes 0 %   Abs Immature Granulocytes 0.04 0.00 - 0.07 K/uL    Comment: Performed at [facility], [place]  [address] [place], [place] [address]  Comprehensive metabolic panel     Status: None   Collection Time: 03/14/18 12:10 PM  Result Value Ref Range   Sodium 140 135 - 145 mmol/L   Potassium 4.3 3.5 - 5.1 mmol/L   Chloride 106 98 - 111 mmol/L   CO2 26 22 - 32 mmol/L   Glucose, Bld 99 70 - 99  mg/dL   BUN 21 8 - 23 mg/dL   Creatinine, Ser 0.74 0.44 - 1.00 mg/dL   Calcium 9.0 8.9 - 10.3 mg/dL   Total Protein 7.0 6.5 - 8.1 g/dL   Albumin 4.1 3.5 - 5.0 g/dL   AST 23 15 - 41 U/L   ALT 27 0 - 44 U/L   Alkaline Phosphatase 50 38 - 126 U/L   Total Bilirubin 0.4 0.3 - 1.2 mg/dL   GFR calc non Af Amer >60 >60 mL/min   GFR calc Af Amer >60 >60 mL/min    Comment: (NOTE) The eGFR has been calculated using the CKD EPI equation. This calculation has not been validated in all clinical situations. eGFR's persistently <60 mL/min signify possible Chronic Kidney Disease.    Anion gap 8 5 - 15    Comment: Performed at [facility], [place] [address] [place], [place] [address]  Protime-INR     Status: None   Collection Time: 03/14/18 12:10 PM  Result Value Ref Range   Prothrombin Time 12.5 11.4 - 15.2 seconds   INR 0.94     Comment: Performed at [facility], [place] [address] [place], [place] [address]  Type and screen Order type and screen if day of surgery is less than 15 days from draw of preadmission visit or order morning of surgery if day of surgery is greater than 6 days from preadmission visit.     Status: None   Collection Time: 03/14/18 12:10 PM  Result Value Ref Range   ABO/RH(D) AB POS    Antibody Screen NEG    Sample Expiration 03/28/2018    Extend sample reason      NO TRANSFUSIONS OR PREGNANCY IN THE PAST 3 MONTHS Performed at [facility], [place] [address] [place], [place] [address]   Urinalysis, Routine w reflex microscopic     Status: Abnormal   Collection Time: 03/14/18 12:10 PM  Result Value Ref Range   Color, Urine STRAW  (A) YELLOW   APPearance CLEAR CLEAR   Specific Gravity, Urine 1.006 1.005 - 1.030   pH 5.0 5.0 - 8.0   Glucose, UA NEGATIVE NEGATIVE mg/dL   Hgb urine dipstick SMALL (A) NEGATIVE   Bilirubin Urine NEGATIVE NEGATIVE   Ketones, ur NEGATIVE NEGATIVE mg/dL   Protein, ur NEGATIVE NEGATIVE mg/dL   Nitrite NEGATIVE NEGATIVE   Leukocytes, UA NEGATIVE NEGATIVE   RBC / HPF 0-5 0 - 5 RBC/hpf   WBC, UA 0-5 0 - 5 WBC/hpf   Bacteria, UA RARE (A) NONE SEEN   Squamous Epithelial / LPF 0-5 0 - 5   Mucus PRESENT     Comment: Performed at [facility], [place] [address] [place], [place] [address]     Estimated body mass index is 40.04 kg/m as calculated from the following:   Height as of 03/14/18: 5' (1.524 m).   Weight as of 03/14/18: 93 kg.   Imaging Review Plain radiographs demonstrate severe degenerative joint disease of the left knee(s). The overall alignment ismild varus. The bone quality appears to be fair for age and reported activity level.   Preoperative templating of the joint replacement has been completed, documented, and submitted to the Operating Room personnel in order to optimize intra-operative equipment management.    Patient's anticipated LOS is less than 2 midnights, meeting these requirements: - Younger than 58 - Lives within 1 hour of care - Has a competent adult at home to recover with  post-op recover - NO history of  - Chronic pain requiring opiods  - Diabetes  - Coronary Artery Disease  - Heart failure  - Heart attack  - Stroke  - DVT/VTE  - Cardiac arrhythmia  - Respiratory Failure/COPD  - Renal failure  - Anemia  - Advanced Liver disease        Assessment/Plan:  End stage arthritis, left knee   The patient history, physical examination, clinical judgment of the provider and imaging studies are consistent with end stage degenerative joint disease of the left knee(s) and total knee arthroplasty is deemed medically necessary. The  treatment options including medical management, injection therapy arthroscopy and arthroplasty were discussed at length. The risks and benefits of total knee arthroplasty were presented and reviewed. The risks due to aseptic loosening, infection, stiffness, patella tracking problems, thromboembolic complications and other imponderables were discussed. The patient acknowledged the explanation, agreed to proceed with the plan and consent was signed. Patient is being admitted for inpatient treatment for surgery, pain control, PT, OT, prophylactic antibiotics, VTE prophylaxis, progressive ambulation and ADL's and discharge planning. The patient is planning to be discharged home with home health services

## 2018-03-21 ENCOUNTER — Other Ambulatory Visit: Payer: Self-pay

## 2018-03-21 ENCOUNTER — Ambulatory Visit (HOSPITAL_COMMUNITY)
Admission: RE | Admit: 2018-03-21 | Discharge: 2018-03-22 | Disposition: A | Discharge status: Home Health | Payer: Medicare Other | Source: Ambulatory Visit | Attending: Orthopedic Surgery | Admitting: Orthopedic Surgery

## 2018-03-21 ENCOUNTER — Ambulatory Visit (HOSPITAL_COMMUNITY): Payer: Medicare Other | Admitting: Anesthesiology

## 2018-03-21 ENCOUNTER — Encounter (HOSPITAL_COMMUNITY): Payer: Self-pay | Admitting: *Deleted

## 2018-03-21 ENCOUNTER — Encounter (HOSPITAL_COMMUNITY)
Admission: RE | Disposition: A | Discharge status: Home Health | Payer: Self-pay | Source: Ambulatory Visit | Attending: Orthopedic Surgery

## 2018-03-21 DIAGNOSIS — G8918 Other acute postprocedural pain: Secondary | ICD-10-CM | POA: Diagnosis not present

## 2018-03-21 DIAGNOSIS — K219 Gastro-esophageal reflux disease without esophagitis: Secondary | ICD-10-CM | POA: Diagnosis not present

## 2018-03-21 DIAGNOSIS — M1712 Unilateral primary osteoarthritis, left knee: Secondary | ICD-10-CM | POA: Diagnosis present

## 2018-03-21 DIAGNOSIS — Z79899 Other long term (current) drug therapy: Secondary | ICD-10-CM | POA: Insufficient documentation

## 2018-03-21 DIAGNOSIS — Z6841 Body Mass Index (BMI) 40.0 and over, adult: Secondary | ICD-10-CM | POA: Diagnosis not present

## 2018-03-21 HISTORY — PX: TOTAL KNEE ARTHROPLASTY: SHX125

## 2018-03-21 LAB — TYPE AND SCREEN
ABO/RH(D): AB POS
Antibody Screen: NEGATIVE

## 2018-03-21 SURGERY — ARTHROPLASTY, KNEE, TOTAL
Anesthesia: Spinal | Site: Knee | Laterality: Left

## 2018-03-21 MED ORDER — DEXAMETHASONE SODIUM PHOSPHATE 10 MG/ML IJ SOLN
INTRAMUSCULAR | Status: AC
  Filled 2018-03-21: qty 1

## 2018-03-21 MED ORDER — PROPOFOL 500 MG/50ML IV EMUL
INTRAVENOUS | Status: DC | PRN
  Administered 2018-03-21: 40 ug/kg/min via INTRAVENOUS

## 2018-03-21 MED ORDER — METHOCARBAMOL 500 MG IVPB - SIMPLE MED
INTRAVENOUS | Status: AC
  Filled 2018-03-21: qty 50

## 2018-03-21 MED ORDER — PANTOPRAZOLE SODIUM 40 MG PO TBEC
80.0000 mg | DELAYED_RELEASE_TABLET | Freq: Every day | ORAL | Status: DC
  Administered 2018-03-22: 80 mg via ORAL
  Filled 2018-03-21: qty 2

## 2018-03-21 MED ORDER — BUPIVACAINE IN DEXTROSE 0.75-8.25 % IT SOLN
INTRATHECAL | Status: DC | PRN
  Administered 2018-03-21: 1.6 mL via INTRATHECAL

## 2018-03-21 MED ORDER — ONDANSETRON HCL 4 MG/2ML IJ SOLN
INTRAMUSCULAR | Status: DC | PRN
  Administered 2018-03-21: 4 mg via INTRAVENOUS

## 2018-03-21 MED ORDER — PREDNISOLON-GATIFLOX-BROMFENAC 1-0.5-0.075 % OP SOLN: 1.0000 [drp] | Freq: Every day | OPHTHALMIC | Status: DC

## 2018-03-21 MED ORDER — ASPIRIN EC 325 MG PO TBEC: 325.0000 mg | DELAYED_RELEASE_TABLET | Freq: Two times a day (BID) | ORAL | 0 refills | Status: AC

## 2018-03-21 MED ORDER — DIPHENHYDRAMINE HCL 12.5 MG/5ML PO ELIX: 12.5000 mg | ORAL_SOLUTION | ORAL | Status: DC | PRN

## 2018-03-21 MED ORDER — FENTANYL CITRATE (PF) 100 MCG/2ML IJ SOLN
INTRAMUSCULAR | Status: DC | PRN
  Administered 2018-03-21: 50 ug via INTRAVENOUS

## 2018-03-21 MED ORDER — METHOCARBAMOL 500 MG PO TABS
500.0000 mg | ORAL_TABLET | Freq: Four times a day (QID) | ORAL | Status: DC | PRN
  Administered 2018-03-21 – 2018-03-22 (×2): 500 mg via ORAL
  Filled 2018-03-21 (×2): qty 1

## 2018-03-21 MED ORDER — SODIUM CHLORIDE 0.9 % IV SOLN: INTRAVENOUS | Status: DC

## 2018-03-21 MED ORDER — PROPOFOL 10 MG/ML IV BOLUS
INTRAVENOUS | Status: AC
  Filled 2018-03-21: qty 20

## 2018-03-21 MED ORDER — BUPIVACAINE LIPOSOME 1.3 % IJ SUSP
20.0000 mL | Freq: Once | INTRAMUSCULAR | Status: DC
  Filled 2018-03-21 (×2): qty 20

## 2018-03-21 MED ORDER — VANCOMYCIN HCL IN DEXTROSE 1-5 GM/200ML-% IV SOLN
1000.0000 mg | Freq: Once | INTRAVENOUS | Status: AC
  Administered 2018-03-21: 1000 mg via INTRAVENOUS
  Filled 2018-03-21: qty 200

## 2018-03-21 MED ORDER — MAGNESIUM CITRATE PO SOLN: 1.0000 | Freq: Once | ORAL | Status: DC | PRN

## 2018-03-21 MED ORDER — EPHEDRINE SULFATE-NACL 50-0.9 MG/10ML-% IV SOSY
PREFILLED_SYRINGE | INTRAVENOUS | Status: DC | PRN
  Administered 2018-03-21 (×2): 5 mg via INTRAVENOUS

## 2018-03-21 MED ORDER — SODIUM CHLORIDE 0.9 % IJ SOLN
INTRAMUSCULAR | Status: DC | PRN
  Administered 2018-03-21: 30 mL

## 2018-03-21 MED ORDER — ONDANSETRON HCL 4 MG PO TABS: 4.0000 mg | ORAL_TABLET | Freq: Four times a day (QID) | ORAL | Status: DC | PRN

## 2018-03-21 MED ORDER — POLYETHYLENE GLYCOL 3350 17 G PO PACK: 17.0000 g | PACK | Freq: Every day | ORAL | Status: DC | PRN

## 2018-03-21 MED ORDER — TRANEXAMIC ACID 1000 MG/10ML IV SOLN
1000.0000 mg | INTRAVENOUS | Status: DC
  Filled 2018-03-21: qty 10

## 2018-03-21 MED ORDER — VANCOMYCIN HCL IN DEXTROSE 1-5 GM/200ML-% IV SOLN: 1000.0000 mg | INTRAVENOUS | Status: DC

## 2018-03-21 MED ORDER — CELECOXIB 200 MG PO CAPS
200.0000 mg | ORAL_CAPSULE | Freq: Two times a day (BID) | ORAL | Status: DC
  Administered 2018-03-21 – 2018-03-22 (×3): 200 mg via ORAL
  Filled 2018-03-21 (×3): qty 1

## 2018-03-21 MED ORDER — 0.9 % SODIUM CHLORIDE (POUR BTL) OPTIME
TOPICAL | Status: DC | PRN
  Administered 2018-03-21: 1000 mL

## 2018-03-21 MED ORDER — VANCOMYCIN HCL IN DEXTROSE 1-5 GM/200ML-% IV SOLN
1000.0000 mg | Freq: Two times a day (BID) | INTRAVENOUS | Status: AC
  Administered 2018-03-21: 1000 mg via INTRAVENOUS
  Filled 2018-03-21: qty 200

## 2018-03-21 MED ORDER — MIDAZOLAM HCL 5 MG/5ML IJ SOLN
INTRAMUSCULAR | Status: DC | PRN
  Administered 2018-03-21: 2 mg via INTRAVENOUS

## 2018-03-21 MED ORDER — DOCUSATE SODIUM 100 MG PO CAPS: 100.0000 mg | ORAL_CAPSULE | Freq: Two times a day (BID) | ORAL | 0 refills | Status: AC

## 2018-03-21 MED ORDER — SODIUM CHLORIDE 0.9 % IR SOLN
Status: DC | PRN
  Administered 2018-03-21: 1000 mL

## 2018-03-21 MED ORDER — TRANEXAMIC ACID-NACL 1000-0.7 MG/100ML-% IV SOLN
1000.0000 mg | INTRAVENOUS | Status: AC
  Administered 2018-03-21: 1000 mg via INTRAVENOUS
  Filled 2018-03-21: qty 100

## 2018-03-21 MED ORDER — OXYCODONE HCL 5 MG/5ML PO SOLN
5.0000 mg | Freq: Once | ORAL | Status: DC | PRN
  Filled 2018-03-21: qty 5

## 2018-03-21 MED ORDER — ALUM & MAG HYDROXIDE-SIMETH 200-200-20 MG/5ML PO SUSP: 30.0000 mL | ORAL | Status: DC | PRN

## 2018-03-21 MED ORDER — DEXAMETHASONE SODIUM PHOSPHATE 10 MG/ML IJ SOLN
INTRAMUSCULAR | Status: DC | PRN
  Administered 2018-03-21: 8 mg via INTRAVENOUS

## 2018-03-21 MED ORDER — GABAPENTIN 300 MG PO CAPS
300.0000 mg | ORAL_CAPSULE | Freq: Two times a day (BID) | ORAL | Status: DC
  Administered 2018-03-21 – 2018-03-22 (×3): 300 mg via ORAL
  Filled 2018-03-21 (×3): qty 1

## 2018-03-21 MED ORDER — ONDANSETRON HCL 4 MG/2ML IJ SOLN
INTRAMUSCULAR | Status: AC
  Filled 2018-03-21: qty 2

## 2018-03-21 MED ORDER — DOCUSATE SODIUM 100 MG PO CAPS
100.0000 mg | ORAL_CAPSULE | Freq: Two times a day (BID) | ORAL | Status: DC
  Administered 2018-03-21 – 2018-03-22 (×3): 100 mg via ORAL
  Filled 2018-03-21 (×3): qty 1

## 2018-03-21 MED ORDER — TRANEXAMIC ACID-NACL 1000-0.7 MG/100ML-% IV SOLN
1000.0000 mg | Freq: Once | INTRAVENOUS | Status: AC
  Administered 2018-03-21: 1000 mg via INTRAVENOUS
  Filled 2018-03-21: qty 100

## 2018-03-21 MED ORDER — HYDROMORPHONE HCL 1 MG/ML IJ SOLN: 0.5000 mg | INTRAMUSCULAR | Status: DC | PRN

## 2018-03-21 MED ORDER — BUPIVACAINE LIPOSOME 1.3 % IJ SUSP
INTRAMUSCULAR | Status: DC | PRN
  Administered 2018-03-21: 20 mL

## 2018-03-21 MED ORDER — OXYCODONE HCL 5 MG PO TABS: 5.0000 mg | ORAL_TABLET | Freq: Once | ORAL | Status: DC | PRN

## 2018-03-21 MED ORDER — MIDAZOLAM HCL 2 MG/2ML IJ SOLN
INTRAMUSCULAR | Status: AC
  Filled 2018-03-21: qty 2

## 2018-03-21 MED ORDER — DEXAMETHASONE SODIUM PHOSPHATE 10 MG/ML IJ SOLN
10.0000 mg | Freq: Two times a day (BID) | INTRAMUSCULAR | Status: DC
  Administered 2018-03-21 – 2018-03-22 (×2): 10 mg via INTRAVENOUS
  Filled 2018-03-21 (×2): qty 1

## 2018-03-21 MED ORDER — LACTATED RINGERS IV SOLN
INTRAVENOUS | Status: DC
  Administered 2018-03-21: 07:00:00 via INTRAVENOUS

## 2018-03-21 MED ORDER — BUPIVACAINE-EPINEPHRINE 0.5% -1:200000 IJ SOLN
INTRAMUSCULAR | Status: DC | PRN
  Administered 2018-03-21: 40 mL

## 2018-03-21 MED ORDER — ONDANSETRON HCL 4 MG/2ML IJ SOLN: 4.0000 mg | Freq: Four times a day (QID) | INTRAMUSCULAR | Status: DC | PRN

## 2018-03-21 MED ORDER — BISACODYL 5 MG PO TBEC: 5.0000 mg | DELAYED_RELEASE_TABLET | Freq: Every day | ORAL | Status: DC | PRN

## 2018-03-21 MED ORDER — PROPOFOL 10 MG/ML IV BOLUS
INTRAVENOUS | Status: AC
  Filled 2018-03-21: qty 40

## 2018-03-21 MED ORDER — ASPIRIN EC 325 MG PO TBEC
325.0000 mg | DELAYED_RELEASE_TABLET | Freq: Two times a day (BID) | ORAL | Status: DC
  Administered 2018-03-21 – 2018-03-22 (×2): 325 mg via ORAL
  Filled 2018-03-21 (×2): qty 1

## 2018-03-21 MED ORDER — CEFAZOLIN SODIUM-DEXTROSE 2-4 GM/100ML-% IV SOLN
2.0000 g | INTRAVENOUS | Status: AC
  Administered 2018-03-21: 2 g via INTRAVENOUS
  Filled 2018-03-21: qty 100

## 2018-03-21 MED ORDER — FENTANYL CITRATE (PF) 100 MCG/2ML IJ SOLN
INTRAMUSCULAR | Status: AC
  Filled 2018-03-21: qty 2

## 2018-03-21 MED ORDER — OXYCODONE-ACETAMINOPHEN 5-325 MG PO TABS: 1.0000 | ORAL_TABLET | Freq: Four times a day (QID) | ORAL | 0 refills | Status: AC | PRN

## 2018-03-21 MED ORDER — METHOCARBAMOL 500 MG IVPB - SIMPLE MED
500.0000 mg | Freq: Four times a day (QID) | INTRAVENOUS | Status: DC | PRN
  Administered 2018-03-21: 500 mg via INTRAVENOUS
  Filled 2018-03-21: qty 50

## 2018-03-21 MED ORDER — ACETAMINOPHEN 325 MG PO TABS: 325.0000 mg | ORAL_TABLET | Freq: Four times a day (QID) | ORAL | Status: DC | PRN

## 2018-03-21 MED ORDER — FENTANYL CITRATE (PF) 100 MCG/2ML IJ SOLN: 25.0000 ug | INTRAMUSCULAR | Status: DC | PRN

## 2018-03-21 MED ORDER — SODIUM CHLORIDE 0.9 % IJ SOLN
INTRAMUSCULAR | Status: AC
  Filled 2018-03-21: qty 50

## 2018-03-21 MED ORDER — BUPIVACAINE-EPINEPHRINE 0.5% -1:200000 IJ SOLN
INTRAMUSCULAR | Status: AC
  Filled 2018-03-21: qty 1

## 2018-03-21 MED ORDER — STERILE WATER FOR IRRIGATION IR SOLN
Status: DC | PRN
  Administered 2018-03-21: 2000 mL

## 2018-03-21 MED ORDER — ROPIVACAINE HCL 7.5 MG/ML IJ SOLN
INTRAMUSCULAR | Status: DC | PRN
  Administered 2018-03-21: 20 mL via PERINEURAL

## 2018-03-21 MED ORDER — ONDANSETRON HCL 4 MG/2ML IJ SOLN: 4.0000 mg | Freq: Once | INTRAMUSCULAR | Status: DC | PRN

## 2018-03-21 MED ORDER — OXYCODONE HCL 5 MG PO TABS
5.0000 mg | ORAL_TABLET | ORAL | Status: DC | PRN
  Administered 2018-03-21 – 2018-03-22 (×3): 10 mg via ORAL
  Administered 2018-03-22: 5 mg via ORAL
  Filled 2018-03-21: qty 1
  Filled 2018-03-21 (×3): qty 2

## 2018-03-21 MED ORDER — CHLORHEXIDINE GLUCONATE 4 % EX LIQD: 60.0000 mL | Freq: Once | CUTANEOUS | Status: DC

## 2018-03-21 SURGICAL SUPPLY — 58 items
APL SKNCLS STERI-STRIP NONHPOA (GAUZE/BANDAGES/DRESSINGS) ×1
ATTUNE PS FEM LT SZ 3 CEM KNEE (Femur) ×2 IMPLANT
ATTUNE PSRP INSR SZ3 5 KNEE (Insert) ×1 IMPLANT
ATTUNE PSRP INSR SZ3 5MM KNEE (Insert) ×1 IMPLANT
BAG SPEC THK2 15X12 ZIP CLS (MISCELLANEOUS) ×1
BAG ZIPLOCK 12X15 (MISCELLANEOUS) ×3 IMPLANT
BANDAGE ACE 6X5 VEL STRL LF (GAUZE/BANDAGES/DRESSINGS) ×3 IMPLANT
BANDAGE ELASTIC 6 VELCRO ST LF (GAUZE/BANDAGES/DRESSINGS) ×2 IMPLANT
BASE TIBIAL ROT PLAT SZ 3 KNEE (Knees) IMPLANT
BENZOIN TINCTURE PRP APPL 2/3 (GAUZE/BANDAGES/DRESSINGS) ×3 IMPLANT
BLADE SAGITTAL 25.0X1.19X90 (BLADE) ×2 IMPLANT
BLADE SAGITTAL 25.0X1.19X90MM (BLADE) ×1
BLADE SAW SGTL 11.0X1.19X90.0M (BLADE) ×3 IMPLANT
BOOTIES KNEE HIGH SLOAN (MISCELLANEOUS) ×3 IMPLANT
BOWL SMART MIX CTS (DISPOSABLE) ×3 IMPLANT
BSPLAT TIB 3 CMNT ROT PLAT STR (Knees) ×1 IMPLANT
CEMENT HV SMART SET (Cement) ×6 IMPLANT
CLOSURE WOUND 1/2 X4 (GAUZE/BANDAGES/DRESSINGS) ×2
COVER WAND RF STERILE (DRAPES) ×2 IMPLANT
CUFF TOURN SGL QUICK 34 (TOURNIQUET CUFF) ×3
CUFF TRNQT CYL 34X4X40X1 (TOURNIQUET CUFF) ×1 IMPLANT
DECANTER SPIKE VIAL GLASS SM (MISCELLANEOUS) ×4 IMPLANT
DRAPE U-SHAPE 47X51 STRL (DRAPES) ×3 IMPLANT
DRSG AQUACEL AG ADV 3.5X10 (GAUZE/BANDAGES/DRESSINGS) ×3 IMPLANT
DURAPREP 26ML APPLICATOR (WOUND CARE) ×3 IMPLANT
ELECT REM PT RETURN 15FT ADLT (MISCELLANEOUS) ×3 IMPLANT
GLOVE BIOGEL PI IND STRL 8 (GLOVE) ×2 IMPLANT
GLOVE BIOGEL PI INDICATOR 8 (GLOVE) ×4
GLOVE ECLIPSE 7.5 STRL STRAW (GLOVE) ×8 IMPLANT
GOWN STRL REUS W/TWL XL LVL3 (GOWN DISPOSABLE) ×6 IMPLANT
HANDPIECE INTERPULSE COAX TIP (DISPOSABLE) ×3
HOLDER FOLEY CATH W/STRAP (MISCELLANEOUS) ×2 IMPLANT
HOOD PEEL AWAY FLYTE STAYCOOL (MISCELLANEOUS) ×9 IMPLANT
IMMOBILIZER KNEE 20 (SOFTGOODS) ×3
IMMOBILIZER KNEE 20 THIGH 36 (SOFTGOODS) ×1 IMPLANT
MANIFOLD NEPTUNE II (INSTRUMENTS) ×3 IMPLANT
NEEDLE HYPO 22GX1.5 SAFETY (NEEDLE) ×3 IMPLANT
NS IRRIG 1000ML POUR BTL (IV SOLUTION) ×3 IMPLANT
PACK ICE MAXI GEL EZY WRAP (MISCELLANEOUS) ×3 IMPLANT
PACK TOTAL KNEE CUSTOM (KITS) ×3 IMPLANT
PADDING CAST COTTON 6X4 STRL (CAST SUPPLIES) ×3 IMPLANT
PATELLA MEDIAL ATTUN 35MM KNEE (Knees) ×2 IMPLANT
POSITIONER SURGICAL ARM (MISCELLANEOUS) ×3 IMPLANT
SET HNDPC FAN SPRY TIP SCT (DISPOSABLE) ×1 IMPLANT
STAPLER VISISTAT 35W (STAPLE) IMPLANT
STRIP CLOSURE SKIN 1/2X4 (GAUZE/BANDAGES/DRESSINGS) ×2 IMPLANT
SUT MNCRL AB 3-0 PS2 18 (SUTURE) ×3 IMPLANT
SUT VIC AB 0 CT1 36 (SUTURE) ×3 IMPLANT
SUT VIC AB 1 CT1 36 (SUTURE) ×6 IMPLANT
SUT VIC AB 2-0 CT1 27 (SUTURE) ×6
SUT VIC AB 2-0 CT1 TAPERPNT 27 (SUTURE) ×2 IMPLANT
SYR CONTROL 10ML LL (SYRINGE) ×6 IMPLANT
TIBIAL BASE ROT PLAT SZ 3 KNEE (Knees) ×3 IMPLANT
TRAY FOLEY CATH 14FRSI W/METER (CATHETERS) ×2 IMPLANT
TRAY FOLEY MTR SLVR 16FR STAT (SET/KITS/TRAYS/PACK) ×1 IMPLANT
WATER STERILE IRR 1000ML POUR (IV SOLUTION) ×6 IMPLANT
WRAP KNEE MAXI GEL POST OP (GAUZE/BANDAGES/DRESSINGS) ×2 IMPLANT
YANKAUER SUCT BULB TIP 10FT TU (MISCELLANEOUS) ×3 IMPLANT

## 2018-03-21 NOTE — Care Plan (Signed)
Ortho Bundle Case Management Note  Patient Details  Name: Courtney Hill MRN: [id number] Date of Birth: 1945-06-30    Spoke with patient prior to surgery. She will discharge to home with family and HHPT.  She has equipment. CPM will be delivered.   Please contact me with questions.                 DME Arranged:  N/A DME Agency:     HH Arranged:  PT HH Agency:  [facility]  Additional Comments: Please contact me with any questions of if this plan should need to change.  Shauna Hugh,  RN,BSN,MHA,CCM  [facility]opaedic Specialist  [phone] 03/21/2018, 12:07 PM

## 2018-03-21 NOTE — Anesthesia Postprocedure Evaluation (Signed)
Anesthesia Post Note  Patient: CHIDINMA CLITES  Procedure(s) Performed: LEFT TOTAL KNEE ARTHROPLASTY (Left Knee)     Patient location during evaluation: PACU Anesthesia Type: Spinal Level of consciousness: awake and alert Pain management: pain level controlled Vital Signs Assessment: post-procedure vital signs reviewed and stable Respiratory status: spontaneous breathing and respiratory function stable Cardiovascular status: blood pressure returned to baseline and stable Postop Assessment: spinal receding and no apparent nausea or vomiting Anesthetic complications: no    Last Vitals:  Vitals:   03/21/18 0945 03/21/18 1000  BP: 121/68 123/67  Pulse: 65 64  Resp: 12 12  Temp:    SpO2: 100% 100%    Last Pain:  Vitals:   03/21/18 1015  TempSrc:   PainSc: 0-No pain                 Beryle Lathe

## 2018-03-21 NOTE — Discharge Instructions (Signed)

## 2018-03-21 NOTE — Brief Op Note (Signed)
03/21/2018  9:05 AM  PATIENT:  Courtney Hill  72 y.o. female  PRE-OPERATIVE DIAGNOSIS:  OSTEOARTHRITIS LEFT KNEE  POST-OPERATIVE DIAGNOSIS:  OSTEOARTHRITIS LEFT KNEE  PROCEDURE:  Procedure(s) with comments: LEFT TOTAL KNEE ARTHROPLASTY (Left) - with abductor block  SURGEON:  Surgeon(s) and Role:    Jodi Geralds, MD - Primary  PHYSICIAN ASSISTANT:   ASSISTANTS: bethune   ANESTHESIA:   spinal  EBL:  50 mL   BLOOD ADMINISTERED:none  DRAINS: none   LOCAL MEDICATIONS USED:  MARCAINE    and OTHER experel  SPECIMEN:  No Specimen  DISPOSITION OF SPECIMEN:  N/A  COUNTS:  YES  TOURNIQUET:   Total Tourniquet Time Documented: Thigh (Left) - 51 minutes Total: Thigh (Left) - 51 minutes   DICTATION: .Other Dictation: Dictation Number [id number]  PLAN OF CARE: Admit for overnight observation  PATIENT DISPOSITION:  PACU - hemodynamically stable.   Delay start of Pharmacological VTE agent (>24hrs) due to surgical blood loss or risk of bleeding: no

## 2018-03-21 NOTE — Anesthesia Procedure Notes (Signed)
Spinal  Patient location during procedure: OR Start time: 03/21/2018 7:28 AM End time: 03/21/2018 7:32 AM Staffing Anesthesiologist: Beryle Lathe, MD Performed: anesthesiologist  Preanesthetic Checklist Completed: patient identified, surgical consent, pre-op evaluation, timeout performed, IV checked, risks and benefits discussed and monitors and equipment checked Spinal Block Patient position: sitting Prep: DuraPrep Patient monitoring: heart rate, cardiac monitor, continuous pulse ox and blood pressure Approach: midline Location: L3-4 Injection technique: single-shot Needle Needle type: Pencan  Needle gauge: 24 G Additional Notes Consent was obtained prior to the procedure with all questions answered and concerns addressed. Risks including, but not limited to, bleeding, infection, nerve damage, paralysis, failed block, inadequate analgesia, allergic reaction, high spinal, itching, and headache were discussed and the patient wished to proceed. Functioning IV was confirmed and monitors were applied. Sterile prep and drape, including hand hygiene, mask, and sterile gloves were used. The patient was positioned and the spine was prepped. The skin was anesthetized with lidocaine. Free flow of clear CSF was obtained prior to injecting local anesthetic into the CSF. The spinal needle aspirated freely following injection. The needle was carefully withdrawn. The patient tolerated the procedure well.   Leslye Peer, MD

## 2018-03-21 NOTE — Op Note (Signed)
NAME: Courtney Hill, Courtney Hill MEDICAL RECORD ZO:[id number] ACCOUNT [id number] DATE OF BIRTH:[birth year] FACILITY: [facility] LOCATION: [facility] PHYSICIAN:Darielle Hancher L. Riata Ikeda, MD  OPERATIVE REPORT  DATE OF PROCEDURE:  03/21/2018  PREOPERATIVE DIAGNOSIS:  End-stage degenerative joint disease, left knee.  POSTOPERATIVE DIAGNOSIS:  End-stage degenerative joint disease, left knee.  PROCEDURE:  Left total knee replacement with an Attune system, size 3 femur, size 3 tibia, 5 mm bridging bearing, and a 35 mm all polyethylene patella.  SURGEON:  Jodi Geralds, MD  ASSISTANT:  Gus Puma, PA-C, who was present throughout the case and was critical in the performance of the case, doing critical portions including cutting, retraction, and closing to minimize OR time.    ANESTHESIA:  Spinal.  BRIEF HISTORY:  The patient is a 72 year old female with a long history of significant complaints of left knee pain that has been treated conservatively for a prolonged period of time.  She had x-rays showing bone-on-bone change.  She was having night  pain and light activity pain.  She was brought to the operating room for left total knee replacement.  DESCRIPTION OF PROCEDURE:  The patient was brought to the operating room after adequate anesthesia was obtained with spinal anesthetic and placed the patient supine on the operating table.  Left leg was prepped and draped in the usual sterile fashion.   Following this, the leg was exsanguinated, blood pressure tourniquet inflated to 300 mmHg.  Following this, a midline incision was made in subcutaneous tissue down to the extensor mechanism.  A medial parapatellar arthrotomy was undertaken.  Medial and  lateral meniscus were removed, retropatellar fat pad, synovium on the anterior aspect of the femur, and the anterior and posterior cruciates.  Following this, an intramedullary pilot hole was drilled in the femur, and a 4-degree valgus inclination cut  was made after an intramedullary  rod was placed.  A 9 mm distal bone was resected.  Attention was then turned to sizing the femur.  It sized to a 3.  Anterior and posterior cuts were made, chamfers and box.  Attention was turned to the tibia where it was  cut perpendicular to its long axis.  It was then drilled and keeled and sized to a 3.  The trial components were put in place.  Patella was freehand cut down to the level of 13 mm.  The lugs were drilled for a 35 patella.  The knee was put through a  range of motion.  Excellent stability and range of motion were achieved.  At this point, all trial components were removed, and then the knee was copiously and thoroughly irrigated with pulsatile lavage irrigation and suctioned dry.  The final components  were then cemented into place, size 3 femur, size 3 tibia, 5 mm bridging bearing trial was placed and a 35 patella was placed and held with a clamp.  All excess bone cement was removed.  Cement was allowed to completely harden.  Exparel was instilled  throughout the synovial reflection for postoperative pain control.  Attention was then turned back to the knee with the 5 trial was removed.  All bleeders controlled with electrocautery.  The tourniquet had been let down at this point.  The final poly  was placed.  The knee was put through a range of motion.  Excellent stability and range of motion were achieved.  The parapatellar arthrotomy was closed with #1 Vicryl running, skin with 0 and 2-0 Vicryl and 3-0 Monocryl subcuticular.  Benzoin,  Steri-Strips applied.  Sterile compressive  dressing was applied, and the patient was taken to recovery room and noted to be in satisfactory condition.  Estimated blood loss for the procedure is minimal.  Of note, Gus Puma assisted throughout the case.   He retracted, he did some bone cuts, and he closed to minimize OR time.  LN/NUANCE  D:03/21/2018 T:03/21/2018 JOB:[id number]

## 2018-03-21 NOTE — Anesthesia Procedure Notes (Signed)
Anesthesia Regional Block: Adductor canal block   Pre-Anesthetic Checklist: ,, timeout performed, Correct Patient, Correct Site, Correct Laterality, Correct Procedure, Correct Position, site marked, Risks and benefits discussed,  Surgical consent,  Pre-op evaluation,  At surgeon's request and post-op pain management  Laterality: Left  Prep: chloraprep       Needles:  Injection technique: Single-shot  Needle Type: Echogenic Needle     Needle Length: 10cm  Needle Gauge: 21     Additional Needles:   Narrative:  Start time: 03/21/2018 7:10 AM End time: 03/21/2018 7:14 AM Injection made incrementally with aspirations every 5 mL.  Performed by: Personally  Anesthesiologist: Beryle Lathe, MD  Additional Notes: No pain on injection. No increased resistance to injection. Injection made in 5cc increments. Good needle visualization. Patient tolerated the procedure well.

## 2018-03-21 NOTE — Evaluation (Signed)
Physical Therapy Evaluation Patient Details Name: Courtney Hill MRN: [id number] DOB: [birth year] Today's Date: 03/21/2018   History of Present Illness  72 YO female s/p L TKR on 03/21/18. PMH includes GERD, obesity, arthritis, lumbar fusion 2008 and 2016, cataracts surgery.   Clinical Impression   Pt presents with L knee pain, decreased L knee ROM, increased time and effort to perform mobility tasks, and decreased tolerance for ambulation. Pt to benefit from acute PT to address deficits. Pt ambulated 50 ft with RW with min guard assist for safety. Will continue to follow acutely, and will progress mobility as able.     Follow Up Recommendations Follow surgeon's recommendation for DC plan and follow-up therapies;Supervision for mobility/OOB(HHPT )    Equipment Recommendations  None recommended by PT    Recommendations for Other Services       Precautions / Restrictions Precautions Precautions: Fall Required Braces or Orthoses: Knee Immobilizer - Left Knee Immobilizer - Left: On when out of bed or walking;Discontinue once straight leg raise with < 10 degree lag Restrictions Weight Bearing Restrictions: No Other Position/Activity Restrictions: WBAT       Mobility  Bed Mobility Overal bed mobility: Needs Assistance Bed Mobility: Supine to Sit     Supine to sit: Min guard;HOB elevated     General bed mobility comments: Min guard for safety. Verbal cuing for sequencing to EOB. Increased time and effort to perform.   Transfers Overall transfer level: Needs assistance Equipment used: Rolling walker (2 wheeled) Transfers: Sit to/from Stand Sit to Stand: Min guard;From elevated surface         General transfer comment: Min guard for safety. Pt with self-steadying with RW upon standing. Verbal cuing for hand placement.   Ambulation/Gait Ambulation/Gait assistance: Min guard Gait Distance (Feet): 50 Feet Assistive device: Rolling walker (2 wheeled) Gait  Pattern/deviations: Step-to pattern;Decreased stride length;Decreased weight shift to left;Decreased stance time - left;Antalgic Gait velocity: very decr (pt is cautious)   General Gait Details: Min guard for safety. Increased time to complete, verbal cuing provided on sequencing and placement in RW.   Stairs            Wheelchair Mobility    Modified Rankin (Stroke Patients Only)       Balance Overall balance assessment: Mild deficits observed, not formally tested                                           Pertinent Vitals/Pain Pain Assessment: 0-10 Pain Score: 2  Pain Descriptors / Indicators: Sore Pain Intervention(s): Limited activity within patient's tolerance;Repositioned;Monitored during session;Ice applied;Premedicated before session    Home Living Family/patient expects to be discharged to:: Private residence Living Arrangements: Alone Available Help at Discharge: Family;Friend(s);Available PRN/intermittently(children setting up schedule to come over to pt's house to help, sister-in-law willing to stay the night with pt on Saturday night) Type of Home: House Home Access: Stairs to enter Entrance Stairs-Rails: None Entrance Stairs-Number of Steps: 1 Home Layout: One level Home Equipment: Bedside commode;Walker - 2 wheels;Walker - 4 wheels;Shower seat      Prior Function Level of Independence: Independent               Hand Dominance   Dominant Hand: Right    Extremity/Trunk Assessment   Upper Extremity Assessment Upper Extremity Assessment: Overall WFL for tasks assessed    Lower Extremity Assessment Lower Extremity Assessment:  Overall WFL for tasks assessed;LLE deficits/detail LLE Deficits / Details: suspected post-surgical weakness; able to perform ankle pumps, SLR with brace applied with assist, quad set  LLE Sensation: WNL    Cervical / Trunk Assessment Cervical / Trunk Assessment: Normal  Communication    Communication: HOH  Cognition Arousal/Alertness: Awake/alert Behavior During Therapy: WFL for tasks assessed/performed Overall Cognitive Status: Within Functional Limits for tasks assessed                                        General Comments      Exercises Total Joint Exercises Ankle Circles/Pumps: AROM;Both;5 reps;Seated Quad Sets: AROM;Left;5 reps;Sidelying   Assessment/Plan    PT Assessment Patient needs continued PT services  PT Problem List Decreased strength;Pain;Decreased range of motion;Decreased activity tolerance;Decreased knowledge of use of DME;Decreased balance;Decreased safety awareness;Decreased mobility       PT Treatment Interventions DME instruction;Therapeutic activities;Gait training;Therapeutic exercise;Patient/family education;Stair training;Functional mobility training;Balance training    PT Goals (Current goals can be found in the Care Plan section)  Acute Rehab PT Goals Patient Stated Goal: none stated  PT Goal Formulation: With patient Time For Goal Achievement: 03/28/18 Potential to Achieve Goals: Good    Frequency 7X/week   Barriers to discharge        Co-evaluation               AM-PAC PT "6 Clicks" Daily Activity  Outcome Measure Difficulty turning over in bed (including adjusting bedclothes, sheets and blankets)?: Unable Difficulty moving from lying on back to sitting on the side of the bed? : Unable Difficulty sitting down on and standing up from a chair with arms (e.g., wheelchair, bedside commode, etc,.)?: Unable Help needed moving to and from a bed to chair (including a wheelchair)?: A Little Help needed walking in hospital room?: A Little Help needed climbing 3-5 steps with a railing? : A Little 6 Click Score: 12    End of Session Equipment Utilized During Treatment: Gait belt;Left knee immobilizer Activity Tolerance: Patient tolerated treatment well Patient left: in chair;with chair alarm set;with call  bell/phone within reach;with family/visitor present Nurse Communication: Mobility status PT Visit Diagnosis: Other abnormalities of gait and mobility (R26.89);Difficulty in walking, not elsewhere classified (R26.2)    Time: 1610-9604 PT Time Calculation (min) (ACUTE ONLY): 35 min   Charges:   PT Evaluation $PT Eval Low Complexity: 1 Low PT Treatments $Gait Training: 8-22 mins        Nicola Police, PT Acute Rehabilitation Services Pager [phone]  Office [phone]   Edrees Valent D Despina Hidden 03/21/2018, 4:56 PM

## 2018-03-21 NOTE — Transfer of Care (Signed)
Immediate Anesthesia Transfer of Care Note  Patient: Courtney Hill  Procedure(s) Performed: LEFT TOTAL KNEE ARTHROPLASTY (Left Knee)  Patient Location: PACU  Anesthesia Type:MAC and Spinal  Level of Consciousness: awake, alert , oriented and patient cooperative  Airway & Oxygen Therapy: Patient Spontanous Breathing and Patient connected to face mask oxygen  Post-op Assessment: Report given to RN and Post -op Vital signs reviewed and stable  Post vital signs: Reviewed and stable  Last Vitals:  Vitals Value Taken Time  BP 120/68 03/21/2018  9:23 AM  Temp    Pulse 82 03/21/2018  9:24 AM  Resp 18 03/21/2018  9:24 AM  SpO2 100 % 03/21/2018  9:24 AM  Vitals shown include unvalidated device data.  Last Pain:  Vitals:   03/21/18 0621  TempSrc:   PainSc: 0-No pain      Patients Stated Pain Goal: 3 (36/46/80 3212)  Complications: No apparent anesthesia complications

## 2018-03-21 NOTE — Addendum Note (Signed)
Addendum  created 03/21/18 1038 by Elisabeth Cara, CRNA   Intraprocedure Flowsheets edited

## 2018-03-22 DIAGNOSIS — Z79899 Other long term (current) drug therapy: Secondary | ICD-10-CM | POA: Diagnosis not present

## 2018-03-22 DIAGNOSIS — M1712 Unilateral primary osteoarthritis, left knee: Secondary | ICD-10-CM | POA: Diagnosis not present

## 2018-03-22 DIAGNOSIS — Z6841 Body Mass Index (BMI) 40.0 and over, adult: Secondary | ICD-10-CM | POA: Diagnosis not present

## 2018-03-22 DIAGNOSIS — K219 Gastro-esophageal reflux disease without esophagitis: Secondary | ICD-10-CM | POA: Diagnosis not present

## 2018-03-22 LAB — CBC
HCT: 34 % — ABNORMAL LOW (ref 36.0–46.0)
Hemoglobin: 10.9 g/dL — ABNORMAL LOW (ref 12.0–15.0)
MCH: 29.8 pg (ref 26.0–34.0)
MCHC: 32.1 g/dL (ref 30.0–36.0)
MCV: 92.9 fL (ref 80.0–100.0)
NRBC: 0 % (ref 0.0–0.2)
PLATELETS: 255 10*3/uL (ref 150–400)
RBC: 3.66 MIL/uL — AB (ref 3.87–5.11)
RDW: 12 % (ref 11.5–15.5)
WBC: 20.5 10*3/uL — AB (ref 4.0–10.5)

## 2018-03-22 NOTE — Care Management Note (Signed)
Case Management Note  Patient Details  Name: Courtney Hill MRN: [id number] Date of Birth: [birth year]  Subjective/Objective:   L TKA                 Action/Plan: Pt preoperatively arranged with [facility] for [facility]. DME was provided to pt by [employer], RW, 3n1 and CPM.   Expected Discharge Date:  03/22/18               Expected Discharge Plan:  Home w Home Health Services  In-House Referral:  NA  Discharge planning Services  CM Consult  Post Acute Care Choice:  Home Health Choice offered to:  Patient  DME Arranged:  N/A DME Agency:  [employer]  HH Arranged:  PT HH Agency:  [facility]  Status of Service:  Completed, signed off  If discussed at [employer], dates discussed:    Additional Comments:  Elliot Cousin, RN 03/22/2018, 10:08 AM

## 2018-03-22 NOTE — Progress Notes (Signed)
Discharged from floor via w/c for transport home by car. Belongings & son with pt. No changes in assessment. Courtney Hill  

## 2018-03-22 NOTE — Progress Notes (Signed)
Physical Therapy Treatment Patient Details Name: Courtney Hill MRN: [id number] DOB: [birth year] Today's Date: 03/22/2018    History of Present Illness 72 YO female s/p L TKR on 03/21/18. PMH includes GERD, obesity, arthritis, lumbar fusion 2008 and 2016, cataracts surgery.     PT Comments    Patient progressing with ambulation and exercise this session.  Should be stable for d/c home today after second PT session for stair training for home entry.   Follow Up Recommendations  Follow surgeon's recommendation for DC plan and follow-up therapies;Supervision for mobility/OOB(pt reports for HHPT)     Equipment Recommendations  None recommended by PT    Recommendations for Other Services       Precautions / Restrictions Precautions Precautions: Fall Required Braces or Orthoses: Knee Immobilizer - Left Knee Immobilizer - Left: On when out of bed or walking;Discontinue once straight leg raise with < 10 degree lag Restrictions Weight Bearing Restrictions: No Other Position/Activity Restrictions: WBAT     Mobility  Bed Mobility               General bed mobility comments: up in recliner  Transfers Overall transfer level: Needs assistance Equipment used: Rolling walker (2 wheeled) Transfers: Sit to/from Stand Sit to Stand: Supervision         General transfer comment: increased time, UE support on armrests  Ambulation/Gait Ambulation/Gait assistance: Supervision;Min guard Gait Distance (Feet): 150 Feet Assistive device: Rolling walker (2 wheeled) Gait Pattern/deviations: Step-through pattern;Step-to pattern;Decreased stride length;Antalgic     General Gait Details: shorter steps with walker and mild antalgia   Stairs             Wheelchair Mobility    Modified Rankin (Stroke Patients Only)       Balance Overall balance assessment: Needs assistance   Sitting balance-Leahy Scale: Good       Standing balance-Leahy Scale: Fair                              Cognition Arousal/Alertness: Awake/alert Behavior During Therapy: WFL for tasks assessed/performed Overall Cognitive Status: Within Functional Limits for tasks assessed                                        Exercises Total Joint Exercises Ankle Circles/Pumps: AROM;10 reps;Both;Seated Quad Sets: Left;10 reps;Seated Short Arc Quad: AROM;10 reps;Left;Seated Heel Slides: AAROM;10 reps;Left;Seated Hip ABduction/ADduction: AROM;10 reps;Left;Seated Straight Leg Raises: AROM;10 reps;Left;Seated Goniometric ROM: grossly 10-60 AAROM L knee    General Comments        Pertinent Vitals/Pain Pain Score: 3  Pain Descriptors / Indicators: Sore;Aching Pain Intervention(s): Monitored during session;Repositioned;Ice applied    Home Living                      Prior Function            PT Goals (current goals can now be found in the care plan section) Progress towards PT goals: Progressing toward goals    Frequency    7X/week      PT Plan Current plan remains appropriate    Co-evaluation              AM-PAC PT "6 Clicks" Daily Activity  Outcome Measure  Difficulty turning over in bed (including adjusting bedclothes, sheets and blankets)?: A Little Difficulty moving from lying on back  to sitting on the side of the bed? : A Little Difficulty sitting down on and standing up from a chair with arms (e.g., wheelchair, bedside commode, etc,.)?: Unable Help needed moving to and from a bed to chair (including a wheelchair)?: A Little Help needed walking in hospital room?: A Little Help needed climbing 3-5 steps with a railing? : A Little 6 Click Score: 16    End of Session Equipment Utilized During Treatment: Gait belt Activity Tolerance: Patient tolerated treatment well Patient left: with call bell/phone within reach;in chair   PT Visit Diagnosis: Other abnormalities of gait and mobility (R26.89);Difficulty in walking, not  elsewhere classified (R26.2)     Time: 6962-9528 PT Time Calculation (min) (ACUTE ONLY): 25 min  Charges:  $Gait Training: 8-22 mins $Therapeutic Exercise: 8-22 mins                     Sheran Lawless, [place] [facility] Services [phone] 03/22/2018   [name] 03/22/2018, 10:48 AM

## 2018-03-22 NOTE — Progress Notes (Addendum)
Subjective: 1 Day Post-Op Procedure(s) (LRB): LEFT TOTAL KNEE ARTHROPLASTY (Left)  Activity level:  wbat Diet tolerance:  ok Voiding:  ok Patient reports pain as mild.    Objective: Vital signs in last 24 hours: Temp:  [97.3 F (36.3 C)-98 F (36.7 C)] 97.5 F (36.4 C) (10/19 0545) Pulse Rate:  [64-91] 76 (10/19 0545) Resp:  [11-18] 16 (10/18 2157) BP: (104-155)/(56-81) 104/81 (10/19 0545) SpO2:  [94 %-100 %] 94 % (10/19 0545)  Labs: Recent Labs    03/22/18 0459  HGB 10.9*   Recent Labs    03/22/18 0459  WBC 20.5*  RBC 3.66*  HCT 34.0*  PLT 255   No results for input(s): NA, K, CL, CO2, BUN, CREATININE, GLUCOSE, CALCIUM in the last 72 hours. No results for input(s): LABPT, INR in the last 72 hours.  Physical Exam:  Neurologically intact ABD soft Neurovascular intact Sensation intact distally Intact pulses distally Dorsiflexion/Plantar flexion intact Incision: dressing C/D/I and no drainage No cellulitis present Compartment soft  Assessment/Plan:  1 Day Post-Op Procedure(s) (LRB): LEFT TOTAL KNEE ARTHROPLASTY (Left) Advance diet Up with therapy Discharge home with home health today after PT. Continue on ASA 325mg  for DVT prevention. Follow up with Dr. Luiz Blare in office   Patient's anticipated LOS is less than 2 midnights, meeting these requirements: - Younger than 56 - Lives within 1 hour of care - Has a competent adult at home to recover with post-op recover - NO history of  - Chronic pain requiring opiods  - Diabetes  - Coronary Artery Disease  - Heart failure  - Heart attack  - Stroke  - DVT/VTE  - Cardiac arrhythmia  - Respiratory Failure/COPD  - Renal failure  - Anemia  - Advanced Liver disease   Kenlei Safi PAUL 03/22/2018, 8:51 AM

## 2018-03-22 NOTE — Plan of Care (Signed)
Pt is stable.Pain management in progress, effective. Problem: Education: Goal: Knowledge of the prescribed therapeutic regimen will improve Outcome: Progressing Goal: Individualized Educational Video(s) Outcome: Progressing   Problem: Activity: Goal: Ability to avoid complications of mobility impairment will improve Outcome: Progressing Goal: Range of joint motion will improve Outcome: Progressing   Problem: Clinical Measurements: Goal: Postoperative complications will be avoided or minimized Outcome: Progressing   Problem: Pain Management: Goal: Pain level will decrease with appropriate interventions Outcome: Progressing   Problem: Skin Integrity: Goal: Will show signs of wound healing Outcome: Progressing

## 2018-03-22 NOTE — Progress Notes (Signed)
Physical Therapy Treatment Patient Details Name: Courtney Hill MRN: [id number] DOB: 09-18-1945 Today's Date: 03/22/2018    History of Present Illness 72 YO female s/p L TKR on 03/21/18. PMH includes GERD, obesity, arthritis, lumbar fusion 2008 and 2016, cataracts surgery.     PT Comments    Patient stable to d/c as able to negotiate one step safely for home entry.  Will have assist from various family and feels comfortable with progress.  Further PT needs to be addressed via HHPT at d/c.   Follow Up Recommendations  Follow surgeon's recommendation for DC plan and follow-up therapies;Supervision for mobility/OOB(HHPT)     Equipment Recommendations  None recommended by PT    Recommendations for Other Services       Precautions / Restrictions Precautions Precautions: Fall Required Braces or Orthoses: Knee Immobilizer - Left Knee Immobilizer - Left: On when out of bed or walking;Discontinue once straight leg raise with < 10 degree lag Restrictions Weight Bearing Restrictions: No Other Position/Activity Restrictions: WBAT     Mobility  Bed Mobility Overal bed mobility: Needs Assistance Bed Mobility: Sit to Supine       Sit to supine: Min guard   General bed mobility comments: for positioning  Transfers Overall transfer level: Needs assistance Equipment used: Rolling walker (2 wheeled) Transfers: Sit to/from Stand Sit to Stand: Supervision         General transfer comment: up from recliner and toilet  Ambulation/Gait Ambulation/Gait assistance: Supervision Gait Distance (Feet): 75 Feet Assistive device: Rolling walker (2 wheeled) Gait Pattern/deviations: Step-through pattern;Decreased stride length     General Gait Details: shorter steps with walker and mild antalgia   Stairs Stairs: Yes Stairs assistance: Min guard Stair Management: Forwards;Backwards;Step to pattern Number of Stairs: 1 General stair comments: educated and pt performed both forward and  back technique with RW   Wheelchair Mobility    Modified Rankin (Stroke Patients Only)       Balance Overall balance assessment: Needs assistance   Sitting balance-Leahy Scale: Good       Standing balance-Leahy Scale: Fair                              Cognition Arousal/Alertness: Awake/alert Behavior During Therapy: WFL for tasks assessed/performed Overall Cognitive Status: Within Functional Limits for tasks assessed                                        Exercises     General Comments General comments (skin integrity, edema, etc.): educated in HEP with handout given and in plan for medication at home at least 30 min prior to PT       Pertinent Vitals/Pain Pain Score: 4  Pain Location: L knee Pain Descriptors / Indicators: Sore;Aching Pain Intervention(s): Monitored during session;Repositioned    Home Living                      Prior Function            PT Goals (current goals can now be found in the care plan section) Progress towards PT goals: Progressing toward goals    Frequency    7X/week      PT Plan Current plan remains appropriate    Co-evaluation              AM-PAC PT "6  Clicks" Daily Activity  Outcome Measure  Difficulty turning over in bed (including adjusting bedclothes, sheets and blankets)?: A Little Difficulty moving from lying on back to sitting on the side of the bed? : A Little Difficulty sitting down on and standing up from a chair with arms (e.g., wheelchair, bedside commode, etc,.)?: A Little Help needed moving to and from a bed to chair (including a wheelchair)?: A Little Help needed walking in hospital room?: A Little Help needed climbing 3-5 steps with a railing? : A Little 6 Click Score: 18    End of Session Equipment Utilized During Treatment: Gait belt Activity Tolerance: Patient tolerated treatment well Patient left: with call bell/phone within reach;in bed   PT Visit  Diagnosis: Other abnormalities of gait and mobility (R26.89);Difficulty in walking, not elsewhere classified (R26.2)     Time: 6010-9323 PT Time Calculation (min) (ACUTE ONLY): 24 min  Charges:  $Gait Training: 8-22 mins $Self Care/Home Management: 8-22                     Courtney Hill, PT Acute Rehabilitation Services [phone] 03/22/2018    Courtney Hill 03/22/2018, 12:12 PM

## 2018-03-23 DIAGNOSIS — Z96652 Presence of left artificial knee joint: Secondary | ICD-10-CM | POA: Diagnosis not present

## 2018-03-23 DIAGNOSIS — K219 Gastro-esophageal reflux disease without esophagitis: Secondary | ICD-10-CM | POA: Diagnosis not present

## 2018-03-23 DIAGNOSIS — Z471 Aftercare following joint replacement surgery: Secondary | ICD-10-CM | POA: Diagnosis not present

## 2018-03-23 DIAGNOSIS — E669 Obesity, unspecified: Secondary | ICD-10-CM | POA: Diagnosis not present

## 2018-03-23 DIAGNOSIS — M47819 Spondylosis without myelopathy or radiculopathy, site unspecified: Secondary | ICD-10-CM | POA: Diagnosis not present

## 2018-03-23 DIAGNOSIS — Z6841 Body Mass Index (BMI) 40.0 and over, adult: Secondary | ICD-10-CM | POA: Diagnosis not present

## 2018-03-23 DIAGNOSIS — G8929 Other chronic pain: Secondary | ICD-10-CM | POA: Diagnosis not present

## 2018-03-23 DIAGNOSIS — M545 Low back pain: Secondary | ICD-10-CM | POA: Diagnosis not present

## 2018-03-25 ENCOUNTER — Encounter (HOSPITAL_COMMUNITY): Payer: Self-pay | Admitting: Orthopedic Surgery

## 2018-03-25 DIAGNOSIS — M545 Low back pain: Secondary | ICD-10-CM | POA: Diagnosis not present

## 2018-03-25 DIAGNOSIS — M47819 Spondylosis without myelopathy or radiculopathy, site unspecified: Secondary | ICD-10-CM | POA: Diagnosis not present

## 2018-03-25 DIAGNOSIS — Z471 Aftercare following joint replacement surgery: Secondary | ICD-10-CM | POA: Diagnosis not present

## 2018-03-25 DIAGNOSIS — K219 Gastro-esophageal reflux disease without esophagitis: Secondary | ICD-10-CM | POA: Diagnosis not present

## 2018-03-25 DIAGNOSIS — E669 Obesity, unspecified: Secondary | ICD-10-CM | POA: Diagnosis not present

## 2018-03-25 DIAGNOSIS — G8929 Other chronic pain: Secondary | ICD-10-CM | POA: Diagnosis not present

## 2018-03-27 DIAGNOSIS — Z471 Aftercare following joint replacement surgery: Secondary | ICD-10-CM | POA: Diagnosis not present

## 2018-03-27 DIAGNOSIS — E669 Obesity, unspecified: Secondary | ICD-10-CM | POA: Diagnosis not present

## 2018-03-27 DIAGNOSIS — G8929 Other chronic pain: Secondary | ICD-10-CM | POA: Diagnosis not present

## 2018-03-27 DIAGNOSIS — M47819 Spondylosis without myelopathy or radiculopathy, site unspecified: Secondary | ICD-10-CM | POA: Diagnosis not present

## 2018-03-27 DIAGNOSIS — M545 Low back pain: Secondary | ICD-10-CM | POA: Diagnosis not present

## 2018-03-27 DIAGNOSIS — K219 Gastro-esophageal reflux disease without esophagitis: Secondary | ICD-10-CM | POA: Diagnosis not present

## 2018-03-28 DIAGNOSIS — M1712 Unilateral primary osteoarthritis, left knee: Secondary | ICD-10-CM | POA: Diagnosis not present

## 2018-04-01 DIAGNOSIS — M47819 Spondylosis without myelopathy or radiculopathy, site unspecified: Secondary | ICD-10-CM | POA: Diagnosis not present

## 2018-04-01 DIAGNOSIS — Z471 Aftercare following joint replacement surgery: Secondary | ICD-10-CM | POA: Diagnosis not present

## 2018-04-01 DIAGNOSIS — K219 Gastro-esophageal reflux disease without esophagitis: Secondary | ICD-10-CM | POA: Diagnosis not present

## 2018-04-01 DIAGNOSIS — E669 Obesity, unspecified: Secondary | ICD-10-CM | POA: Diagnosis not present

## 2018-04-01 DIAGNOSIS — M545 Low back pain: Secondary | ICD-10-CM | POA: Diagnosis not present

## 2018-04-01 DIAGNOSIS — G8929 Other chronic pain: Secondary | ICD-10-CM | POA: Diagnosis not present

## 2018-04-04 DIAGNOSIS — K219 Gastro-esophageal reflux disease without esophagitis: Secondary | ICD-10-CM | POA: Diagnosis not present

## 2018-04-04 DIAGNOSIS — G8929 Other chronic pain: Secondary | ICD-10-CM | POA: Diagnosis not present

## 2018-04-04 DIAGNOSIS — M47819 Spondylosis without myelopathy or radiculopathy, site unspecified: Secondary | ICD-10-CM | POA: Diagnosis not present

## 2018-04-04 DIAGNOSIS — M545 Low back pain: Secondary | ICD-10-CM | POA: Diagnosis not present

## 2018-04-04 DIAGNOSIS — Z471 Aftercare following joint replacement surgery: Secondary | ICD-10-CM | POA: Diagnosis not present

## 2018-04-04 DIAGNOSIS — E669 Obesity, unspecified: Secondary | ICD-10-CM | POA: Diagnosis not present

## 2018-04-07 DIAGNOSIS — R260 Ataxic gait: Secondary | ICD-10-CM | POA: Diagnosis not present

## 2018-04-07 DIAGNOSIS — Z471 Aftercare following joint replacement surgery: Secondary | ICD-10-CM | POA: Diagnosis not present

## 2018-04-07 DIAGNOSIS — M1712 Unilateral primary osteoarthritis, left knee: Secondary | ICD-10-CM | POA: Diagnosis not present

## 2018-04-07 DIAGNOSIS — Z96652 Presence of left artificial knee joint: Secondary | ICD-10-CM | POA: Diagnosis not present

## 2018-04-07 DIAGNOSIS — M25562 Pain in left knee: Secondary | ICD-10-CM | POA: Diagnosis not present

## 2018-04-10 DIAGNOSIS — R260 Ataxic gait: Secondary | ICD-10-CM | POA: Diagnosis not present

## 2018-04-10 DIAGNOSIS — M25562 Pain in left knee: Secondary | ICD-10-CM | POA: Diagnosis not present

## 2018-04-10 DIAGNOSIS — Z96652 Presence of left artificial knee joint: Secondary | ICD-10-CM | POA: Diagnosis not present

## 2018-04-10 DIAGNOSIS — Z471 Aftercare following joint replacement surgery: Secondary | ICD-10-CM | POA: Diagnosis not present

## 2018-04-15 DIAGNOSIS — M25562 Pain in left knee: Secondary | ICD-10-CM | POA: Diagnosis not present

## 2018-04-15 DIAGNOSIS — Z471 Aftercare following joint replacement surgery: Secondary | ICD-10-CM | POA: Diagnosis not present

## 2018-04-15 DIAGNOSIS — Z96652 Presence of left artificial knee joint: Secondary | ICD-10-CM | POA: Diagnosis not present

## 2018-04-15 DIAGNOSIS — R260 Ataxic gait: Secondary | ICD-10-CM | POA: Diagnosis not present

## 2018-04-17 DIAGNOSIS — Z96652 Presence of left artificial knee joint: Secondary | ICD-10-CM | POA: Diagnosis not present

## 2018-04-17 DIAGNOSIS — R260 Ataxic gait: Secondary | ICD-10-CM | POA: Diagnosis not present

## 2018-04-17 DIAGNOSIS — Z471 Aftercare following joint replacement surgery: Secondary | ICD-10-CM | POA: Diagnosis not present

## 2018-04-17 DIAGNOSIS — M25562 Pain in left knee: Secondary | ICD-10-CM | POA: Diagnosis not present

## 2018-04-22 DIAGNOSIS — Z471 Aftercare following joint replacement surgery: Secondary | ICD-10-CM | POA: Diagnosis not present

## 2018-04-22 DIAGNOSIS — R260 Ataxic gait: Secondary | ICD-10-CM | POA: Diagnosis not present

## 2018-04-22 DIAGNOSIS — M25562 Pain in left knee: Secondary | ICD-10-CM | POA: Diagnosis not present

## 2018-04-22 DIAGNOSIS — Z96652 Presence of left artificial knee joint: Secondary | ICD-10-CM | POA: Diagnosis not present

## 2018-04-24 DIAGNOSIS — M25562 Pain in left knee: Secondary | ICD-10-CM | POA: Diagnosis not present

## 2018-04-24 DIAGNOSIS — Z471 Aftercare following joint replacement surgery: Secondary | ICD-10-CM | POA: Diagnosis not present

## 2018-04-24 DIAGNOSIS — R260 Ataxic gait: Secondary | ICD-10-CM | POA: Diagnosis not present

## 2018-04-24 DIAGNOSIS — Z96652 Presence of left artificial knee joint: Secondary | ICD-10-CM | POA: Diagnosis not present

## 2018-04-29 DIAGNOSIS — Z471 Aftercare following joint replacement surgery: Secondary | ICD-10-CM | POA: Diagnosis not present

## 2018-04-29 DIAGNOSIS — M25562 Pain in left knee: Secondary | ICD-10-CM | POA: Diagnosis not present

## 2018-04-29 DIAGNOSIS — Z9889 Other specified postprocedural states: Secondary | ICD-10-CM | POA: Diagnosis not present

## 2018-04-29 DIAGNOSIS — Z96652 Presence of left artificial knee joint: Secondary | ICD-10-CM | POA: Diagnosis not present

## 2018-04-29 DIAGNOSIS — R260 Ataxic gait: Secondary | ICD-10-CM | POA: Diagnosis not present

## 2018-05-13 DIAGNOSIS — Z471 Aftercare following joint replacement surgery: Secondary | ICD-10-CM | POA: Diagnosis not present

## 2018-05-13 DIAGNOSIS — R260 Ataxic gait: Secondary | ICD-10-CM | POA: Diagnosis not present

## 2018-05-13 DIAGNOSIS — Z96652 Presence of left artificial knee joint: Secondary | ICD-10-CM | POA: Diagnosis not present

## 2018-05-13 DIAGNOSIS — M25562 Pain in left knee: Secondary | ICD-10-CM | POA: Diagnosis not present

## 2018-05-15 DIAGNOSIS — R260 Ataxic gait: Secondary | ICD-10-CM | POA: Diagnosis not present

## 2018-05-15 DIAGNOSIS — Z471 Aftercare following joint replacement surgery: Secondary | ICD-10-CM | POA: Diagnosis not present

## 2018-05-15 DIAGNOSIS — M25562 Pain in left knee: Secondary | ICD-10-CM | POA: Diagnosis not present

## 2018-05-15 DIAGNOSIS — Z96652 Presence of left artificial knee joint: Secondary | ICD-10-CM | POA: Diagnosis not present

## 2018-05-20 DIAGNOSIS — Z96652 Presence of left artificial knee joint: Secondary | ICD-10-CM | POA: Diagnosis not present

## 2018-05-20 DIAGNOSIS — M25562 Pain in left knee: Secondary | ICD-10-CM | POA: Diagnosis not present

## 2018-05-20 DIAGNOSIS — Z471 Aftercare following joint replacement surgery: Secondary | ICD-10-CM | POA: Diagnosis not present

## 2018-05-20 DIAGNOSIS — R260 Ataxic gait: Secondary | ICD-10-CM | POA: Diagnosis not present

## 2018-05-22 DIAGNOSIS — R260 Ataxic gait: Secondary | ICD-10-CM | POA: Diagnosis not present

## 2018-05-22 DIAGNOSIS — Z471 Aftercare following joint replacement surgery: Secondary | ICD-10-CM | POA: Diagnosis not present

## 2018-05-22 DIAGNOSIS — Z96652 Presence of left artificial knee joint: Secondary | ICD-10-CM | POA: Diagnosis not present

## 2018-05-22 DIAGNOSIS — M25562 Pain in left knee: Secondary | ICD-10-CM | POA: Diagnosis not present

## 2018-06-03 DIAGNOSIS — J209 Acute bronchitis, unspecified: Secondary | ICD-10-CM | POA: Diagnosis not present

## 2020-11-24 DIAGNOSIS — H04123 Dry eye syndrome of bilateral lacrimal glands: Secondary | ICD-10-CM | POA: Diagnosis not present

## 2020-11-24 DIAGNOSIS — Z961 Presence of intraocular lens: Secondary | ICD-10-CM | POA: Diagnosis not present

## 2020-11-24 DIAGNOSIS — H1013 Acute atopic conjunctivitis, bilateral: Secondary | ICD-10-CM | POA: Diagnosis not present

## 2021-10-09 ENCOUNTER — Other Ambulatory Visit: Payer: Self-pay | Admitting: Family Medicine

## 2021-10-09 DIAGNOSIS — Z1231 Encounter for screening mammogram for malignant neoplasm of breast: Secondary | ICD-10-CM

## 2021-10-11 ENCOUNTER — Ambulatory Visit
Admission: RE | Admit: 2021-10-11 | Discharge: 2021-10-11 | Disposition: A | Discharge status: Home / Self Care | Payer: Medicare Other | Source: Ambulatory Visit | Attending: Family Medicine | Admitting: Family Medicine

## 2021-10-11 DIAGNOSIS — Z1231 Encounter for screening mammogram for malignant neoplasm of breast: Secondary | ICD-10-CM

## 2021-11-20 DIAGNOSIS — M25511 Pain in right shoulder: Secondary | ICD-10-CM | POA: Diagnosis not present

## 2021-11-30 ENCOUNTER — Emergency Department (HOSPITAL_COMMUNITY)
Admission: EM | Admit: 2021-11-30 | Discharge: 2021-12-01 | Disposition: A | Discharge status: Home / Self Care | Payer: Federal, State, Local not specified - PPO | Attending: Student | Admitting: Student

## 2021-11-30 ENCOUNTER — Encounter (HOSPITAL_COMMUNITY): Payer: Self-pay

## 2021-11-30 ENCOUNTER — Other Ambulatory Visit: Payer: Self-pay

## 2021-11-30 DIAGNOSIS — W272XXA Contact with scissors, initial encounter: Secondary | ICD-10-CM | POA: Insufficient documentation

## 2021-11-30 DIAGNOSIS — Z23 Encounter for immunization: Secondary | ICD-10-CM | POA: Insufficient documentation

## 2021-11-30 DIAGNOSIS — S65302A Unspecified injury of deep palmar arch of left hand, initial encounter: Secondary | ICD-10-CM | POA: Diagnosis present

## 2021-11-30 DIAGNOSIS — S61412A Laceration without foreign body of left hand, initial encounter: Secondary | ICD-10-CM | POA: Diagnosis not present

## 2021-11-30 DIAGNOSIS — Z7982 Long term (current) use of aspirin: Secondary | ICD-10-CM | POA: Diagnosis not present

## 2021-11-30 NOTE — ED Triage Notes (Addendum)
Pt reports her hand slipped when she was using scissors and it stabbed her left hand, has laceration to left pad of skin just below her left thumb. Bleeding controlled at triage.

## 2021-11-30 NOTE — ED Provider Triage Note (Signed)
Emergency Medicine Provider Triage Evaluation Note  CORENE RESNICK , a 76 y.o. female  was evaluated in triage.  Pt complains of puncture wound/laceration to left lateral thumb.  Patient slipped with her hand while using a pair of scissors. Laceration approximately 1cm.  Patient has normal range of motion of the thumb, normal sensation, brisk cap refill  Review of Systems  Positive: Laceration Negative:   Physical Exam  BP (!) 143/78 (BP Location: Right Arm)   Pulse 90   Temp (!) 97.5 F (36.4 C) (Oral)   Resp 16   SpO2 95%  Gen:   Awake, no distress   Resp:  Normal effort  MSK:   Moves extremities without difficulty  Other:    Medical Decision Making  Medically screening exam initiated at 9:10 PM.  Appropriate orders placed.  Glendon Fiser Toenjes was informed that the remainder of the evaluation will be completed by another provider, this initial triage assessment does not replace that evaluation, and the importance of remaining in the ED until their evaluation is complete.     Pamala Duffel 11/30/21 2112

## 2021-12-01 ENCOUNTER — Emergency Department (HOSPITAL_COMMUNITY): Payer: Federal, State, Local not specified - PPO

## 2021-12-01 DIAGNOSIS — S61412A Laceration without foreign body of left hand, initial encounter: Secondary | ICD-10-CM | POA: Diagnosis not present

## 2021-12-01 MED ORDER — LIDOCAINE HCL (PF) 1 % IJ SOLN
5.0000 mL | Freq: Once | INTRAMUSCULAR | Status: AC
  Administered 2021-12-01: 5 mL
  Filled 2021-12-01: qty 5

## 2021-12-01 MED ORDER — TETANUS-DIPHTH-ACELL PERTUSSIS 5-2.5-18.5 LF-MCG/0.5 IM SUSY
0.5000 mL | PREFILLED_SYRINGE | Freq: Once | INTRAMUSCULAR | Status: AC
  Administered 2021-12-01: 0.5 mL via INTRAMUSCULAR
  Filled 2021-12-01: qty 0.5

## 2021-12-01 MED ORDER — CEPHALEXIN 500 MG PO CAPS: 500.0000 mg | ORAL_CAPSULE | Freq: Four times a day (QID) | ORAL | 0 refills | Status: AC

## 2021-12-01 NOTE — ED Notes (Signed)
Patient verbalizes understanding of discharge instructions. Opportunity for questioning and answers were provided. Armband removed by staff, pt discharged from ED and ambulated to lobby to return home with family.  

## 2021-12-01 NOTE — ED Provider Notes (Signed)
[facility] EMERGENCY DEPARTMENT Provider Note   CSN: [id number] Arrival date & time: 11/30/21  2046     History  Chief Complaint  Patient presents with   Laceration    Courtney Hill is a 76 y.o. female.  HPI  With medical history including GERD, presents with complaints of a laceration to her left hand, she sustained a after she accidentally stabbed her self with a scissor, states that she was pulling something with her left hand it slipped and went into her right hand which was holding a scissor.  She denies any paresthesias or weakness wound on her thumb, she is still full range of motion, she is not immunocompromise, does not remember the last time she had a tetanus shot.  Home Medications Prior to Admission medications   Medication Sig Start Date End Date Taking? Authorizing Provider  cephALEXin (KEFLEX) 500 MG capsule Take 1 capsule (500 mg total) by mouth 4 (four) times daily for 7 days. 12/01/21 12/08/21 Yes Carroll Sage, PA-C  aspirin EC 325 MG tablet Take 1 tablet (325 mg total) by mouth 2 (two) times daily after a meal. Take x 1 month post op to decrease risk of blood clots. 03/21/18   Marshia Ly, PA-C  docusate sodium (COLACE) 100 MG capsule Take 1 capsule (100 mg total) by mouth 2 (two) times daily. 03/21/18   Marshia Ly, PA-C  metaxalone (SKELAXIN) 800 MG tablet Take 800 mg by mouth 2 (two) times daily as needed for muscle spasms.    [provider]  omeprazole (PRILOSEC) 40 MG capsule Take 40 mg by mouth daily.     [provider]  oxyCODONE-acetaminophen (PERCOCET/ROXICET) 5-325 MG tablet Take 1-2 tablets by mouth every 6 (six) hours as needed for severe pain. 03/21/18   Marshia Ly, PA-C  Prednisolon-Gatiflox-Bromfenac 1-0.5-0.075 % SOLN Place 1 drop into the right eye daily.    [provider]      Allergies    Betadine [povidone iodine]    Review of Systems   Review of Systems  Constitutional:   Negative for chills and fever.  Respiratory:  Negative for shortness of breath.   Cardiovascular:  Negative for chest pain.  Gastrointestinal:  Negative for abdominal pain.  Skin:  Positive for wound.  Neurological:  Negative for headaches.    Physical Exam Updated Vital Signs BP (!) 171/74 (BP Location: Right Arm)   Pulse 65   Temp 97.8 F (36.6 C) (Oral)   Resp 17   Ht 5' (1.524 m)   SpO2 99%   BMI 40.04 kg/m  Physical Exam Vitals and nursing note reviewed.  Constitutional:      General: She is not in acute distress.    Appearance: She is not ill-appearing.  HENT:     Head: Normocephalic and atraumatic.     Nose: No congestion.  Eyes:     Conjunctiva/sclera: Conjunctivae normal.  Cardiovascular:     Rate and Rhythm: Normal rate and regular rhythm.     Pulses: Normal pulses.     Heart sounds:     No friction rub.  Musculoskeletal:     Comments: There is a horizontal laceration noted in the dorsum aspect of the left thumb at the distal aspect of the first metacarpal, measuring approximately 2 mm in length, and 2 to 3 mm in depth, no foreign body present, no ligament or tendon damage present.  She has full range of motion at all joints in her thumb, neurovascular  fully intact.  Skin:    General: Skin is warm and dry.  Neurological:     Mental Status: She is alert.  Psychiatric:        Mood and Affect: Mood normal.     ED Results / Procedures / Treatments   Labs (all labs ordered are listed, but only abnormal results are displayed) Labs Reviewed - No data to display  EKG None  Radiology DG Finger Thumb Left  Result Date: 12/01/2021 CLINICAL DATA:  Accidentally stabbed left thumb with scissors. EXAM: LEFT THUMB 2+V COMPARISON:  None Available. FINDINGS: There is normal bone mineralization without evidence of fracture or dislocation. Bone-on-bone joint space loss and moderate osteophytosis is seen of the first [facility] joint with mild narrowing and trace spurring of the  thumb MCP and IP joints. No radiopaque foreign body or soft tissue gas are seen. Ventral fracture fixation plating hardware is based against the distal radius and appears chronic. IMPRESSION: Degenerative changes. No evidence of fractures or radiopaque foreign body. Old ORIF. Electronically Signed   By: Almira Bar M.D.   On: 12/01/2021 03:12    Procedures .Marland KitchenLaceration Repair  Date/Time: 12/01/2021 3:54 AM  Performed by: Carroll Sage, PA-C Authorized by: Carroll Sage, PA-C   Consent:    Consent obtained:  Verbal   Consent given by:  Patient   Risks discussed:  Infection, pain, retained foreign body, need for additional repair, poor cosmetic result, tendon damage, vascular damage, poor wound healing and nerve damage   Alternatives discussed:  No treatment, delayed treatment, observation and referral Anesthesia:    Anesthesia method:  Local infiltration   Local anesthetic:  Lidocaine 1% w/o epi Laceration details:    Location:  Hand   Hand location:  L hand, dorsum   Length (cm):  2   Depth (mm):  3 Pre-procedure details:    Preparation:  Patient was prepped and draped in usual sterile fashion and imaging obtained to evaluate for foreign bodies Exploration:    Imaging outcome: foreign body not noted     Wound exploration: wound explored through full range of motion and entire depth of wound visualized     Contaminated: no   Treatment:    Area cleansed with:  Saline   Amount of cleaning:  Extensive   Irrigation method:  Pressure wash   Visualized foreign bodies/material removed: no     Debridement:  None Skin repair:    Repair method:  Sutures   Suture size:  4-0   Suture material:  Prolene   Suture technique:  Simple interrupted Approximation:    Approximation:  Loose Repair type:    Repair type:  Simple Post-procedure details:    Procedure completion:  Tolerated well, no immediate complications     Medications Ordered in ED Medications  lidocaine (PF)  (XYLOCAINE) 1 % injection 5 mL (has no administration in time range)    ED Course/ Medical Decision Making/ A&P                           Medical Decision Making Amount and/or Complexity of Data Reviewed Radiology: ordered.  Risk Prescription drug management.   This patient presents to the ED for concern of thumb laceration, this involves an extensive number of treatment options, and is a complaint that carries with it a high risk of complications and morbidity.  The differential diagnosis includes fracture, dislocation, tendon damage    Additional history obtained:  Additional  history obtained from N/A External records from outside source obtained and reviewed including immunization record   Co morbidities that complicate the patient evaluation  N/A  Social Determinants of Health:  Geriatric    Lab Tests:  I Ordered, and personally interpreted labs.  The pertinent results include: N/A   Imaging Studies ordered:  I ordered imaging studies including DG of left thumb I independently visualized and interpreted imaging which showed negative acute findings I agree with the radiologist interpretation   Cardiac Monitoring:  The patient was maintained on a cardiac monitor.  I personally viewed and interpreted the cardiac monitored which showed an underlying rhythm of: N/A   Medicines ordered and prescription drug management:  I ordered medication including lidocaine, Tdap I have reviewed the patients home medicines and have made adjustments as needed  Critical Interventions:  N/A   Reevaluation:  Presents with a laceration to the left thumb, she was obtain imaging which were unremarkable. Will recommend suturing to decrease infection risk and to assist with the healing process.  Patient was agreeable with this and tolerated the procedure well. sHe received 1 sutures.  Neurovascular was fully intact after the procedure.    Consultations  Obtained:  N/A    Test Considered:  N/A    Rule out Low suspicion for fracture or dislocation as x-ray does not reveal any acute findings. low suspicion for ligament or tendon damage as area was palpated no gross defects noted, she had full range of motion in her left thumb.  Low suspicion for compartment syndrome as area was palpated it was soft to the touch, neurovascular fully intact before and after the procedure   Dispostion and problem list  After consideration of the diagnostic results and the patients response to treatment, I feel that the patent would benefit from discharge.  1.  Laceration-received only 1 suture which was approximated to decrease risk of infection as this was puncture wound, it was decided to place a suture as it was gaping, will start her on antibiotics, follow-up Tsuture removal strict return precautions.            Final Clinical Impression(s) / ED Diagnoses Final diagnoses:  Laceration of left hand without foreign body, initial encounter    Rx / DC Orders ED Discharge Orders          Ordered    cephALEXin (KEFLEX) 500 MG capsule  4 times daily        12/01/21 0356              Carroll Sage, PA-C 12/01/21 0357    Sabas Sous, MD 12/01/21 [phone]

## 2021-12-01 NOTE — Discharge Instructions (Signed)
You have 1 suture in your hand, please start changing the dressings starting tomorrow and rinsing out the wound at least 1 time daily, started you on antibiotics please take a prescribed.  Follow-up with your PCP and/or this department for suture removal neck 7 to 10 days  Come back to the emergency department if you develop chest pain, shortness of breath, severe abdominal pain, uncontrolled nausea, vomiting, diarrhea.

## 2022-03-01 DIAGNOSIS — M533 Sacrococcygeal disorders, not elsewhere classified: Secondary | ICD-10-CM | POA: Diagnosis not present

## 2022-03-05 ENCOUNTER — Other Ambulatory Visit: Payer: Self-pay | Admitting: Orthopedic Surgery

## 2022-03-05 DIAGNOSIS — G8929 Other chronic pain: Secondary | ICD-10-CM

## 2022-03-21 ENCOUNTER — Ambulatory Visit
Admission: RE | Admit: 2022-03-21 | Discharge: 2022-03-21 | Disposition: A | Discharge status: Home / Self Care | Payer: Medicare Other | Source: Ambulatory Visit | Attending: Orthopedic Surgery | Admitting: Orthopedic Surgery

## 2022-03-21 DIAGNOSIS — G8929 Other chronic pain: Secondary | ICD-10-CM

## 2022-03-21 DIAGNOSIS — M545 Low back pain, unspecified: Secondary | ICD-10-CM | POA: Diagnosis not present

## 2022-03-22 ENCOUNTER — Other Ambulatory Visit: Payer: Self-pay | Admitting: Orthopedic Surgery

## 2022-03-22 DIAGNOSIS — M545 Low back pain, unspecified: Secondary | ICD-10-CM

## 2022-04-02 ENCOUNTER — Ambulatory Visit
Admission: RE | Admit: 2022-04-02 | Discharge: 2022-04-02 | Disposition: A | Discharge status: Home / Self Care | Payer: Medicare Other | Source: Ambulatory Visit | Attending: Orthopedic Surgery | Admitting: Orthopedic Surgery

## 2022-04-02 DIAGNOSIS — M545 Low back pain, unspecified: Secondary | ICD-10-CM

## 2022-04-02 DIAGNOSIS — M4326 Fusion of spine, lumbar region: Secondary | ICD-10-CM | POA: Diagnosis not present

## 2022-04-02 MED ORDER — METHYLPREDNISOLONE ACETATE 80 MG/ML IJ SUSP
80.0000 mg | Freq: Once | INTRAMUSCULAR | Status: AC
  Administered 2022-04-02: 80 mg via INTRA_ARTICULAR

## 2022-09-28 DIAGNOSIS — M533 Sacrococcygeal disorders, not elsewhere classified: Secondary | ICD-10-CM | POA: Diagnosis not present

## 2022-10-22 DIAGNOSIS — M533 Sacrococcygeal disorders, not elsewhere classified: Secondary | ICD-10-CM | POA: Diagnosis not present

## 2022-11-28 DIAGNOSIS — H04123 Dry eye syndrome of bilateral lacrimal glands: Secondary | ICD-10-CM | POA: Diagnosis not present

## 2022-11-28 DIAGNOSIS — Z961 Presence of intraocular lens: Secondary | ICD-10-CM | POA: Diagnosis not present

## 2022-11-28 DIAGNOSIS — H1013 Acute atopic conjunctivitis, bilateral: Secondary | ICD-10-CM | POA: Diagnosis not present

## 2022-12-20 DIAGNOSIS — L821 Other seborrheic keratosis: Secondary | ICD-10-CM | POA: Diagnosis not present

## 2022-12-20 DIAGNOSIS — Z85828 Personal history of other malignant neoplasm of skin: Secondary | ICD-10-CM | POA: Diagnosis not present

## 2022-12-20 DIAGNOSIS — D225 Melanocytic nevi of trunk: Secondary | ICD-10-CM | POA: Diagnosis not present

## 2022-12-20 DIAGNOSIS — D170 Benign lipomatous neoplasm of skin and subcutaneous tissue of head, face and neck: Secondary | ICD-10-CM | POA: Diagnosis not present

## 2022-12-20 DIAGNOSIS — L814 Other melanin hyperpigmentation: Secondary | ICD-10-CM | POA: Diagnosis not present

## 2022-12-20 DIAGNOSIS — B070 Plantar wart: Secondary | ICD-10-CM | POA: Diagnosis not present

## 2022-12-20 DIAGNOSIS — R238 Other skin changes: Secondary | ICD-10-CM | POA: Diagnosis not present

## 2022-12-20 DIAGNOSIS — Z08 Encounter for follow-up examination after completed treatment for malignant neoplasm: Secondary | ICD-10-CM | POA: Diagnosis not present

## 2023-01-29 ENCOUNTER — Other Ambulatory Visit: Payer: Self-pay | Admitting: Orthopedic Surgery

## 2023-01-29 DIAGNOSIS — G8929 Other chronic pain: Secondary | ICD-10-CM

## 2023-02-13 ENCOUNTER — Ambulatory Visit
Admission: RE | Admit: 2023-02-13 | Discharge: 2023-02-13 | Disposition: A | Discharge status: Home / Self Care | Payer: Medicare Other | Source: Ambulatory Visit | Attending: Orthopedic Surgery | Admitting: Orthopedic Surgery

## 2023-02-13 DIAGNOSIS — M533 Sacrococcygeal disorders, not elsewhere classified: Secondary | ICD-10-CM | POA: Diagnosis not present

## 2023-02-13 DIAGNOSIS — G8929 Other chronic pain: Secondary | ICD-10-CM

## 2023-03-01 DIAGNOSIS — M533 Sacrococcygeal disorders, not elsewhere classified: Secondary | ICD-10-CM | POA: Diagnosis not present

## 2023-07-21 DIAGNOSIS — J159 Unspecified bacterial pneumonia: Secondary | ICD-10-CM | POA: Diagnosis not present

## 2024-02-18 ENCOUNTER — Other Ambulatory Visit: Payer: Self-pay | Admitting: Orthopedic Surgery

## 2024-02-18 DIAGNOSIS — M5416 Radiculopathy, lumbar region: Secondary | ICD-10-CM

## 2024-03-02 ENCOUNTER — Ambulatory Visit
Admission: RE | Admit: 2024-03-02 | Discharge: 2024-03-02 | Disposition: A | Discharge status: Home / Self Care | Source: Ambulatory Visit | Attending: Orthopedic Surgery | Admitting: Orthopedic Surgery

## 2024-03-02 DIAGNOSIS — M5416 Radiculopathy, lumbar region: Secondary | ICD-10-CM

## 2024-03-02 MED ORDER — METHYLPREDNISOLONE ACETATE 80 MG/ML IJ SUSP: 80.0000 mg | Freq: Once | INTRAMUSCULAR | Status: DC

## 2024-04-29 ENCOUNTER — Ambulatory Visit: Attending: Nurse Practitioner | Admitting: Audiologist

## 2024-04-29 DIAGNOSIS — H903 Sensorineural hearing loss, bilateral: Secondary | ICD-10-CM | POA: Insufficient documentation

## 2024-04-29 NOTE — Procedures (Signed)
  Outpatient [facility] and [facility] [address] [place], [place] [address] [phone]  AUDIOLOGICAL EVALUATION  Name: Courtney Hill    Status: Outpatient DOB: 1946/03/23    Referent: Loring Tanda Mae, MD MRN: [id number] Date: 04/29/2024     Diagnosis: Sensorineural hearing loss, bilateral   HISTORY: Courtney Hill, 78 y.o., was seen for an audiological evaluation.   Courtney Hill notices increased difficulty hearing and that the left ear has been hearing better than the right ear for the past few months.  Courtney Hill notes no ear pain, pressure, fullness, or dizziness.  Courtney Hill reports no history of noise exposure. There is no report of tinnitus, family history of hearing loss, or history of ear surgery or ear infections. She wears [employer] hearing aids that are 79 years old and is interested in obtaining new hearing aids.         EVALUATION: Otoscopic inspection reveals clear ear canals with visible tympanic membranes.   Tympanometry was completed to assess middle ear status. Normal, Type A tympanograms were obtained bilaterally.  Standard audiometric techniques were used to obtain thresholds under headphones and rechecked under inserts. Speech reception thresholds are 65 dBHL on the right and 50 dBHL on the left using recorded spondee word lists. Word recognition was 72% at 85 dBHL on the right and 76% at 80 dBHL on the left using recorded NU-6 word lists, in quiet. A mild sloping to moderately severe sensorineural hearing loss is present bilaterally. The right ear hearing is significantly poorer at 500Hz -1000Hz .   CONCLUSION:  [name] has a mild sloping to moderately severe sensorineural hearing loss bilaterally.  Word recognition is fair in quiet at elevated conversational speech levels bilaterally. Findings were reviewed with the patient. ENT referral is recommended due to asymmetry of hearing.   RECOMMENDATIONS: Medical evaluation by an Ear, Nose and Throat physician due to  asymmetry of hearing. Repeat hearing testing as medically indicated. Use of binaural amplification to assist in daily listening situations. A list of area hearing aid providers was given to the patient. Use of communication strategies to improve communication in difficult listening situations.  Audiogram scanned in under media tab. 50 minutes spent testing and counseling.  Delon EMERSON Baumgartner, Au.D., CCC-A Audiologist 04/29/2024  cc: Loring Tanda Mae, MD
